# Patient Record
Sex: Male | Born: 1990 | Race: Black or African American | Hispanic: No | Marital: Single | Smoking: Never smoker
Health system: Southern US, Community
[De-identification: ages and names within clinical notes are randomized; demographics above are authoritative.]

## PROBLEM LIST (undated history)

## (undated) HISTORY — PX: ROTATOR CUFF REPAIR: SHX139

---
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
## 2012-06-02 ENCOUNTER — Encounter (HOSPITAL_COMMUNITY): Payer: Self-pay | Admitting: *Deleted

## 2012-06-02 ENCOUNTER — Emergency Department (HOSPITAL_COMMUNITY)
Admission: EM | Admit: 2012-06-02 | Discharge: 2012-06-03 | Disposition: A | Discharge status: Home / Self Care | Payer: Self-pay | Attending: Emergency Medicine | Admitting: Emergency Medicine

## 2012-06-02 DIAGNOSIS — R300 Dysuria: Secondary | ICD-10-CM | POA: Insufficient documentation

## 2012-06-02 DIAGNOSIS — R369 Urethral discharge, unspecified: Secondary | ICD-10-CM | POA: Insufficient documentation

## 2012-06-02 LAB — URINALYSIS, ROUTINE W REFLEX MICROSCOPIC
Ketones, ur: NEGATIVE mg/dL
Leukocytes, UA: NEGATIVE
Nitrite: NEGATIVE
Protein, ur: NEGATIVE mg/dL
Urobilinogen, UA: 1 mg/dL (ref 0.0–1.0)
pH: 6 (ref 5.0–8.0)

## 2012-06-02 NOTE — ED Notes (Signed)
Pt c/o pain with urination x 1 wk; denies penile discharge

## 2012-06-03 MED ORDER — CEFTRIAXONE SODIUM 250 MG IJ SOLR
250.0000 mg | Freq: Once | INTRAMUSCULAR | Status: AC
  Administered 2012-06-03: 250 mg via INTRAMUSCULAR
  Filled 2012-06-03: qty 250

## 2012-06-03 MED ORDER — AZITHROMYCIN 250 MG PO TABS
1000.0000 mg | ORAL_TABLET | Freq: Once | ORAL | Status: AC
  Administered 2012-06-03: 1000 mg via ORAL
  Filled 2012-06-03: qty 4

## 2012-06-03 NOTE — ED Provider Notes (Signed)
History     CSN: [id number]  Arrival date & time 06/02/12  2131   First MD Initiated Contact with Patient 06/02/12 2331      Chief Complaint  Patient presents with  . urinary symptoms     (Consider location/radiation/quality/duration/timing/severity/associated sxs/prior treatment) Patient is a 21 y.o. male presenting with dysuria. The history is provided by the patient.  Dysuria  This is a new problem. The current episode started more than 2 days ago. The problem occurs every urination. The problem has not changed since onset.There has been no fever. Pertinent negatives include no nausea and no flank pain. Associated symptoms comments: He complains of burning with urination for one week without fever, abdominal pain, testicular or scrotal pain or swelling. He has had an inconsistent penile discharge.Marland Kitchen    History reviewed. No pertinent past medical history.  History reviewed. No pertinent past surgical history.  No family history on file.  History  Substance Use Topics  . Smoking status: Never Smoker   . Smokeless tobacco: Not on file  . Alcohol Use: No      Review of Systems  Constitutional: Negative for fever.  Gastrointestinal: Negative for nausea and abdominal pain.  Genitourinary: Positive for dysuria and discharge. Negative for flank pain.  Musculoskeletal: Negative for myalgias.    Allergies  Review of patient's allergies indicates no known allergies.  Home Medications  No current outpatient prescriptions on file.  BP 135/62  Pulse 58  Temp 97.7 F (36.5 C)  Resp 20  SpO2 99%  Physical Exam  Constitutional: He is oriented to person, place, and time. He appears well-developed and well-nourished.  Neck: Normal range of motion.  Pulmonary/Chest: Effort normal.  Abdominal: Soft. There is no tenderness. There is no guarding.  Genitourinary:       No penile discharge on exam. No testicular pain or scrotal swelling. No palpable inguinal lymphadenopathy.   Musculoskeletal: Normal range of motion.  Neurological: He is alert and oriented to person, place, and time.  Skin: Skin is warm and dry.  Psychiatric: He has a normal mood and affect.    ED Course  Procedures (including critical care time)  Labs Reviewed  URINALYSIS, ROUTINE W REFLEX MICROSCOPIC - Abnormal; Notable for the following:    APPearance CLOUDY (*)     Specific Gravity, Urine 1.037 (*)     Bilirubin Urine SMALL (*)     All other components within normal limits  GC/CHLAMYDIA PROBE AMP   No results found.   No diagnosis found.  1. Dysuria.   MDM  Dysuria and c/o penile discharge c/w likely STD. Abx given. Referred to [facility].        Rodena Medin, PA-C 06/03/12 0101

## 2012-06-03 NOTE — ED Provider Notes (Signed)
Medical screening examination/treatment/procedure(s) were performed by non-physician practitioner and as supervising physician I was immediately available for consultation/collaboration.   Sian Joles M Khristian Phillippi, MD 06/03/12 0819 

## 2012-08-15 ENCOUNTER — Encounter (HOSPITAL_COMMUNITY): Payer: Self-pay | Admitting: *Deleted

## 2012-08-15 ENCOUNTER — Emergency Department (HOSPITAL_COMMUNITY)
Admission: EM | Admit: 2012-08-15 | Discharge: 2012-08-15 | Disposition: A | Discharge status: Home / Self Care | Payer: Self-pay | Attending: Emergency Medicine | Admitting: Emergency Medicine

## 2012-08-15 ENCOUNTER — Emergency Department (HOSPITAL_COMMUNITY): Payer: Self-pay

## 2012-08-15 DIAGNOSIS — R109 Unspecified abdominal pain: Secondary | ICD-10-CM | POA: Insufficient documentation

## 2012-08-15 DIAGNOSIS — R110 Nausea: Secondary | ICD-10-CM | POA: Insufficient documentation

## 2012-08-15 LAB — COMPREHENSIVE METABOLIC PANEL
AST: 26 U/L (ref 0–37)
CO2: 28 mEq/L (ref 19–32)
Calcium: 9.5 mg/dL (ref 8.4–10.5)
Creatinine, Ser: 1.16 mg/dL (ref 0.50–1.35)
GFR calc Af Amer: >90 mL/min (ref >90)
GFR calc non Af Amer: 90 mL/min — ABNORMAL LOW (ref >90)
Sodium: 138 mEq/L (ref 135–145)
Total Protein: 7.6 g/dL (ref 6.0–8.3)

## 2012-08-15 LAB — CBC WITH DIFFERENTIAL/PLATELET
Basophils Absolute: 0 10*3/uL (ref 0.0–0.1)
Eosinophils Absolute: 0.1 10*3/uL (ref 0.0–0.7)
Eosinophils Relative: 2 % (ref 0–5)
HCT: 46.6 % (ref 39.0–52.0)
Lymphocytes Relative: 30 % (ref 12–46)
MCH: 30.4 pg (ref 26.0–34.0)
MCHC: 34.5 g/dL (ref 30.0–36.0)
MCV: 87.9 fL (ref 78.0–100.0)
Monocytes Absolute: 0.7 10*3/uL (ref 0.1–1.0)
Platelets: 204 10*3/uL (ref 150–400)
RDW: 12.8 % (ref 11.5–15.5)
WBC: 5.1 10*3/uL (ref 4.0–10.5)

## 2012-08-15 LAB — URINALYSIS, MICROSCOPIC ONLY
Glucose, UA: NEGATIVE mg/dL
Leukocytes, UA: NEGATIVE
Protein, ur: NEGATIVE mg/dL
Specific Gravity, Urine: 1.026 (ref 1.005–1.030)
Urobilinogen, UA: 1 mg/dL (ref 0.0–1.0)

## 2012-08-15 MED ORDER — IOHEXOL 300 MG/ML  SOLN
100.0000 mL | Freq: Once | INTRAMUSCULAR | Status: AC | PRN
  Administered 2012-08-15: 100 mL via INTRAVENOUS

## 2012-08-15 MED ORDER — DICYCLOMINE HCL 20 MG PO TABS: 20.0000 mg | ORAL_TABLET | Freq: Four times a day (QID) | ORAL | Status: DC | PRN

## 2012-08-15 MED ORDER — IOHEXOL 300 MG/ML  SOLN: 50.0000 mL | Freq: Once | INTRAMUSCULAR | Status: DC | PRN

## 2012-08-15 MED ORDER — KETOROLAC TROMETHAMINE 15 MG/ML IJ SOLN
15.0000 mg | Freq: Once | INTRAMUSCULAR | Status: AC
  Administered 2012-08-15: 15 mg via INTRAVENOUS
  Filled 2012-08-15: qty 1

## 2012-08-15 NOTE — ED Provider Notes (Signed)
History    21yM with abdominal pain. Intermittent for over a month but worst in past week. Onset shortly after eating or drinking anything. Sharp and brief duration. Lower abdomen, typically worse on L side. No n/v. No urinary complaints. No testicular pain or swelling. No hxof abdominal surgery. Otherwise healthy.  CSN: [id number]  Arrival date & time 08/15/12  4098   First MD Initiated Contact with Patient 08/15/12 [phone]      Chief Complaint  Patient presents with  . Abdominal Pain    (Consider location/radiation/quality/duration/timing/severity/associated sxs/prior treatment) HPI  History reviewed. No pertinent past medical history.  History reviewed. No pertinent past surgical history.  History reviewed. No pertinent family history.  History  Substance Use Topics  . Smoking status: Never Smoker   . Smokeless tobacco: Not on file  . Alcohol Use: No      Review of Systems  All systems reviewed and negative, other than as noted in HPI.   Allergies  Review of patient's allergies indicates no known allergies.  Home Medications   Current Outpatient Rx  Name  Route  Sig  Dispense  Refill  . ibuprofen (ADVIL,MOTRIN) 800 MG tablet   Oral   Take 800 mg by mouth every 8 (eight) hours as needed for pain.           BP 129/74  Pulse 64  Temp(Src) 98.8 F (37.1 C) (Oral)  Resp 16  SpO2 99%  Physical Exam  Nursing note and vitals reviewed. Constitutional: He appears well-developed and well-nourished. No distress.  HENT:  Head: Normocephalic and atraumatic.  Eyes: Conjunctivae are normal. Right eye exhibits no discharge. Left eye exhibits no discharge.  Neck: Neck supple.  Cardiovascular: Normal rate, regular rhythm and normal heart sounds.  Exam reveals no gallop and no friction rub.   No murmur heard. Pulmonary/Chest: Effort normal and breath sounds normal. No respiratory distress.  Abdominal: Soft. He exhibits no distension. There is no tenderness.  Mild  tenderness in right lower and left lower quadrants without guarding or rebound tenderness. No distention. No mass palpated.  Musculoskeletal: He exhibits no edema and no tenderness.  Neurological: He is alert.  Skin: Skin is warm and dry.  Psychiatric: He has a normal mood and affect. His behavior is normal. Thought content normal.    ED Course  Procedures (including critical care time)  Labs Reviewed  CBC WITH DIFFERENTIAL - Abnormal; Notable for the following:    Monocytes Relative 14 (*)    All other components within normal limits  COMPREHENSIVE METABOLIC PANEL - Abnormal; Notable for the following:    GFR calc non Af Amer 90 (*)    All other components within normal limits  URINALYSIS, MICROSCOPIC ONLY - Abnormal; Notable for the following:    Hgb urine dipstick MODERATE (*)    All other components within normal limits  LIPASE, BLOOD   No results found.  Ct Abdomen Pelvis W Contrast  08/15/2012  *RADIOLOGY REPORT*  Clinical Data: Abdominal pain.  Left lower quadrant pain.  Nausea.  CT ABDOMEN AND PELVIS WITH CONTRAST  Technique:  Multidetector CT imaging of the abdomen and pelvis was performed following the standard protocol during bolus administration of intravenous contrast.  Contrast: OMNIPAQUE IOHEXOL 300 MG/ML  SOLN  Comparison: None.  Findings: Lung bases clear.  Liver and spleen appear normal. Gallbladder and common bile duct normal.  Pancreas normal.  Kidneys and adrenal glands are normal.  There is normal renal enhancement. The stomach is normal.  Small bowel also appears normal.  There is prominent stool burden.  Normal appendix is identified in the right lower quadrant.  No free fluid in the abdomen.  The urinary bladder appears normal.  There are no colonic inflammatory changes.  There is a large stool burden.  Mesenteric fat appears within normal limits.  There are no aggressive osseous lesions.  No pars defects. Sacralization of the left L5 transverse process is  noted with pseudoarthrosis.  IMPRESSION: No acute abnormality.   Original Report Authenticated By: Andreas Newport, M.D.     1. Abdominal pain       MDM  21yM with abdominal pain. Mild tenderness on exam and reassuring w/u. Low suspicion for emergent etiology. Emergent return precautions discussed. Outpt FU otherwise.        Raeford Razor, MD 08/18/12 [phone]

## 2012-08-15 NOTE — ED Notes (Signed)
Pt reports left lower quadrant abdominal pain 9/10 every time he eats/ drinks x7 days. "hurts when he breathes sometimes". Last bowel movement 2 days ago. Reports nausea, denies v/d.

## 2012-08-15 NOTE — ED Notes (Signed)
Patient transported to CT 

## 2012-11-28 ENCOUNTER — Emergency Department (HOSPITAL_COMMUNITY)
Admission: EM | Admit: 2012-11-28 | Discharge: 2012-11-29 | Disposition: A | Discharge status: Home / Self Care | Payer: Self-pay | Attending: Emergency Medicine | Admitting: Emergency Medicine

## 2012-11-28 ENCOUNTER — Encounter (HOSPITAL_COMMUNITY): Payer: Self-pay

## 2012-11-28 DIAGNOSIS — R109 Unspecified abdominal pain: Secondary | ICD-10-CM

## 2012-11-28 DIAGNOSIS — R1031 Right lower quadrant pain: Secondary | ICD-10-CM | POA: Insufficient documentation

## 2012-11-28 LAB — CBC WITH DIFFERENTIAL/PLATELET
Basophils Relative: 0 % (ref 0–1)
Eosinophils Absolute: 0.2 10*3/uL (ref 0.0–0.7)
Eosinophils Relative: 2 % (ref 0–5)
HCT: 43.9 % (ref 39.0–52.0)
Hemoglobin: 14.8 g/dL (ref 13.0–17.0)
MCH: 29.3 pg (ref 26.0–34.0)
MCHC: 33.7 g/dL (ref 30.0–36.0)
MCV: 86.9 fL (ref 78.0–100.0)
Monocytes Absolute: 0.7 10*3/uL (ref 0.1–1.0)
Monocytes Relative: 10 % (ref 3–12)
RDW: 12.9 % (ref 11.5–15.5)

## 2012-11-28 LAB — URINALYSIS, ROUTINE W REFLEX MICROSCOPIC
Bilirubin Urine: NEGATIVE
Hgb urine dipstick: NEGATIVE
Ketones, ur: NEGATIVE mg/dL
Specific Gravity, Urine: 1.037 — ABNORMAL HIGH (ref 1.005–1.030)
Urobilinogen, UA: 1 mg/dL (ref 0.0–1.0)

## 2012-11-28 LAB — BASIC METABOLIC PANEL
BUN: 16 mg/dL (ref 6–23)
Calcium: 9.9 mg/dL (ref 8.4–10.5)
Chloride: 101 mEq/L (ref 96–112)
Creatinine, Ser: 1.03 mg/dL (ref 0.50–1.35)
GFR calc Af Amer: >90 mL/min (ref >90)
GFR calc non Af Amer: >90 mL/min (ref >90)

## 2012-11-28 NOTE — ED Provider Notes (Signed)
History     CSN: [id number]  Arrival date & time 11/28/12  2116   First MD Initiated Contact with Patient 11/28/12 2259      Chief Complaint  Patient presents with  . Abdominal Pain    HPI  History provided by the patient. Patient is a 22 year old male with no significant PMH who presents with complaints of continued and persistent right lower abdominal pain and discomfort. Symptoms began 3 days ago with a squeezing pain to the right lower cautery. Pain has been persistent without any significant change over the 3 days. He does report episodes of cramps and sharp pains lasting 2 minutes at most. These have not increased in frequency and have continued to happen at anytime during the day. He does not recognize any pattern or aggravating factors to cause worsening cramp pains. He has slight decrease in appetite but has continued to eat without change in pain. Denies any associated nausea or vomiting. Denies any fever, chills or sweats. No diarrhea or constipation. Pain is not changed with urination or defecation. Denies any dysuria, hematuria or flank pain. He has not used any treatments or medications for symptoms. No other aggravating or alleviating factors. No other associated symptoms.    History reviewed. No pertinent past medical history.  Past Surgical History  Procedure Laterality Date  . Rotator cuff repair      No family history on file.  History  Substance Use Topics  . Smoking status: Never Smoker   . Smokeless tobacco: Not on file  . Alcohol Use: No      Review of Systems  Constitutional: Positive for appetite change. Negative for fever, chills and diaphoresis.  Gastrointestinal: Positive for abdominal pain. Negative for nausea, vomiting, diarrhea, constipation and blood in stool.  Genitourinary: Negative for dysuria, frequency, hematuria, flank pain, discharge, scrotal swelling and testicular pain.  All other systems reviewed and are negative.    Allergies   Review of patient's allergies indicates no known allergies.  Home Medications  No current outpatient prescriptions on file.  BP 123/68  Pulse 63  Temp(Src) 99 F (37.2 C) (Oral)  Resp 18  Ht 5\' 3"  (1.6 m)  Wt 150 lb (68.04 kg)  BMI 26.58 kg/m2  SpO2 99%  Physical Exam  Nursing note and vitals reviewed. Constitutional: He is oriented to person, place, and time. He appears well-developed and well-nourished. No distress.  HENT:  Head: Normocephalic.  Cardiovascular: Normal rate and regular rhythm.   Pulmonary/Chest: Effort normal and breath sounds normal. No respiratory distress. He has no wheezes. He has no rales.  Abdominal: Soft. There is no hepatosplenomegaly. There is tenderness in the right lower quadrant. There is no rigidity, no rebound, no guarding, no CVA tenderness and negative Murphy's sign.  Musculoskeletal: Normal range of motion.  Neurological: He is alert and oriented to person, place, and time.  Skin: Skin is warm.  Psychiatric: He has a normal mood and affect. His behavior is normal.    ED Course  Procedures   Results for orders placed during the hospital encounter of 11/28/12  CBC WITH DIFFERENTIAL      Result Value Range   WBC 7.5  4.0 - 10.5 K/uL   RBC 5.05  4.22 - 5.81 MIL/uL   Hemoglobin 14.8  13.0 - 17.0 g/dL   HCT 13.2  44.0 - 10.2 %   MCV 86.9  78.0 - 100.0 fL   MCH 29.3  26.0 - 34.0 pg   MCHC 33.7  30.0 -  36.0 g/dL   RDW 16.1  09.6 - 04.5 %   Platelets 232  150 - 400 K/uL   Neutrophils Relative % 40 (*) 43 - 77 %   Neutro Abs 3.0  1.7 - 7.7 K/uL   Lymphocytes Relative 49 (*) 12 - 46 %   Lymphs Abs 3.6  0.7 - 4.0 K/uL   Monocytes Relative 10  3 - 12 %   Monocytes Absolute 0.7  0.1 - 1.0 K/uL   Eosinophils Relative 2  0 - 5 %   Eosinophils Absolute 0.2  0.0 - 0.7 K/uL   Basophils Relative 0  0 - 1 %   Basophils Absolute 0.0  0.0 - 0.1 K/uL  BASIC METABOLIC PANEL      Result Value Range   Sodium 137  135 - 145 mEq/L   Potassium 3.8  3.5  - 5.1 mEq/L   Chloride 101  96 - 112 mEq/L   CO2 26  19 - 32 mEq/L   Glucose, Bld 101 (*) 70 - 99 mg/dL   BUN 16  6 - 23 mg/dL   Creatinine, Ser 4.09  0.50 - 1.35 mg/dL   Calcium 9.9  8.4 - 81.1 mg/dL   GFR calc non Af Amer >90  >90 mL/min   GFR calc Af Amer >90  >90 mL/min  LIPASE, BLOOD      Result Value Range   Lipase 22  11 - 59 U/L  URINALYSIS, ROUTINE W REFLEX MICROSCOPIC      Result Value Range   Color, Urine YELLOW  YELLOW   APPearance CLEAR  CLEAR   Specific Gravity, Urine 1.037 (*) 1.005 - 1.030   pH 6.0  5.0 - 8.0   Glucose, UA NEGATIVE  NEGATIVE mg/dL   Hgb urine dipstick NEGATIVE  NEGATIVE   Bilirubin Urine NEGATIVE  NEGATIVE   Ketones, ur NEGATIVE  NEGATIVE mg/dL   Protein, ur NEGATIVE  NEGATIVE mg/dL   Urobilinogen, UA 1.0  0.0 - 1.0 mg/dL   Nitrite NEGATIVE  NEGATIVE   Leukocytes, UA NEGATIVE  NEGATIVE        1. Abdominal pain       MDM  11:00PM patient seen and evaluated. The patient appears comfortable and sleep. He wakes easily denies significant discomfort or pains at this time. Patient does have primary discomforts in the right lower quadrant on abdominal exam. No peritoneal signs. Patient afebrile. Normal WBC.   I discussed options with patient have a CAT scan performed for further evaluation of the appendix. I discussed the benefits, risks and alternatives and at this time he does not wish to have a CAT scan performed. He was given strict return precautions and advised to have a recheck of symptoms the next 24 hours.     Angus Seller, PA-C 11/29/12 0000

## 2012-11-28 NOTE — ED Notes (Signed)
Pt presents with abdominal pain that started three days ago. Pt has had no vomiting or diarrhea. Pt says pain is in the lower right quadrant. Pt still has his appendix.

## 2012-11-28 NOTE — ED Notes (Signed)
Pt states pain (tightening) comes and goes in right lower quadrant of abdomen for the past 3 days.

## 2012-11-29 NOTE — ED Provider Notes (Signed)
Medical screening examination/treatment/procedure(s) were performed by non-physician practitioner and as supervising physician I was immediately available for consultation/collaboration.  Jacayla Nordell M Farrell Broerman, MD 11/29/12 0609 

## 2012-12-02 ENCOUNTER — Emergency Department (HOSPITAL_COMMUNITY): Payer: Self-pay

## 2012-12-02 ENCOUNTER — Emergency Department (HOSPITAL_COMMUNITY)
Admission: EM | Admit: 2012-12-02 | Discharge: 2012-12-03 | Disposition: A | Discharge status: Home / Self Care | Payer: Self-pay | Attending: Emergency Medicine | Admitting: Emergency Medicine

## 2012-12-02 DIAGNOSIS — R112 Nausea with vomiting, unspecified: Secondary | ICD-10-CM | POA: Insufficient documentation

## 2012-12-02 DIAGNOSIS — R195 Other fecal abnormalities: Secondary | ICD-10-CM | POA: Insufficient documentation

## 2012-12-02 DIAGNOSIS — R109 Unspecified abdominal pain: Secondary | ICD-10-CM

## 2012-12-02 DIAGNOSIS — R1031 Right lower quadrant pain: Secondary | ICD-10-CM | POA: Insufficient documentation

## 2012-12-02 LAB — CBC WITH DIFFERENTIAL/PLATELET
Eosinophils Absolute: 0.1 10*3/uL (ref 0.0–0.7)
Eosinophils Relative: 1 % (ref 0–5)
Lymphs Abs: 2.5 10*3/uL (ref 0.7–4.0)
MCH: 29.9 pg (ref 26.0–34.0)
MCV: 86.8 fL (ref 78.0–100.0)
Monocytes Relative: 12 % (ref 3–12)
Platelets: 257 10*3/uL (ref 150–400)
RBC: 5.15 MIL/uL (ref 4.22–5.81)

## 2012-12-02 LAB — COMPREHENSIVE METABOLIC PANEL
BUN: 15 mg/dL (ref 6–23)
Calcium: 9.8 mg/dL (ref 8.4–10.5)
GFR calc Af Amer: >90 mL/min (ref >90)
Glucose, Bld: 100 mg/dL — ABNORMAL HIGH (ref 70–99)
Sodium: 139 mEq/L (ref 135–145)
Total Protein: 7.7 g/dL (ref 6.0–8.3)

## 2012-12-02 LAB — URINALYSIS, ROUTINE W REFLEX MICROSCOPIC
Nitrite: NEGATIVE
Specific Gravity, Urine: 1.041 — ABNORMAL HIGH (ref 1.005–1.030)
Urobilinogen, UA: 1 mg/dL (ref 0.0–1.0)

## 2012-12-02 LAB — LIPASE, BLOOD: Lipase: 20 U/L (ref 11–59)

## 2012-12-02 MED ORDER — IOHEXOL 300 MG/ML  SOLN
50.0000 mL | Freq: Once | INTRAMUSCULAR | Status: AC | PRN
  Administered 2012-12-02: 50 mL via ORAL

## 2012-12-02 MED ORDER — SODIUM CHLORIDE 0.9 % IV BOLUS (SEPSIS)
1000.0000 mL | Freq: Once | INTRAVENOUS | Status: AC
  Administered 2012-12-02: 1000 mL via INTRAVENOUS

## 2012-12-02 MED ORDER — IOHEXOL 300 MG/ML  SOLN
100.0000 mL | Freq: Once | INTRAMUSCULAR | Status: AC | PRN
  Administered 2012-12-02: 100 mL via INTRAVENOUS

## 2012-12-02 NOTE — ED Notes (Signed)
Pt states that he was here last week for RLQ pain but refused CT. Pt states now that he has no appetite, continuing pain and n/v/d.

## 2012-12-02 NOTE — ED Provider Notes (Signed)
History     CSN: [id number]  Arrival date & time 12/02/12  1843   First MD Initiated Contact with Patient 12/02/12 1911      Chief Complaint  Patient presents with  . RLQ pain     (Consider location/radiation/quality/duration/timing/severity/associated sxs/prior treatment) HPI This 22 year old healthy male has several days of gradual onset right lower quadrant abdominal pain and now has a couple of days of nausea with a small amount of nonbloody vomiting today when he tried to eat as well as small amount couple loose stools today which is not large or bloody, his pain started out mildly and is now moderately severe worse with palpation and position changes without radiation or associated symptoms other than he mild nausea and small amounts of vomiting and loose stools today he is no testicular pain no dysuria no radiation no chest pain no shortness of breath no cough no fever no rash no trauma no colicky pain no flank pain no back pain and no treatment prior to arrival.  No past medical history on file.  Past Surgical History  Procedure Laterality Date  . Rotator cuff repair      No family history on file.  History  Substance Use Topics  . Smoking status: Never Smoker   . Smokeless tobacco: Not on file  . Alcohol Use: No      Review of Systems 10 Systems reviewed and are negative for acute change except as noted in the HPI. Allergies  Review of patient's allergies indicates no known allergies.  Home Medications   Current Outpatient Rx  Name  Route  Sig  Dispense  Refill  . acetaminophen (TYLENOL) 500 MG tablet   Oral   Take 500 mg by mouth every 6 (six) hours as needed for pain.         . promethazine (PHENERGAN) 25 MG tablet   Oral   Take 1 tablet (25 mg total) by mouth every 6 (six) hours as needed for nausea.   12 tablet   0     BP 137/76  Pulse 77  Temp(Src) 98.8 F (37.1 C) (Oral)  SpO2 97%  Physical Exam  Nursing note and vitals  reviewed. Constitutional:  Awake, alert, nontoxic appearance.  HENT:  Head: Atraumatic.  Eyes: Right eye exhibits no discharge. Left eye exhibits no discharge.  Neck: Neck supple.  Cardiovascular: Normal rate and regular rhythm.   No murmur heard. Pulmonary/Chest: Effort normal and breath sounds normal. No respiratory distress. He has no wheezes. He has no rales. He exhibits no tenderness.  Abdominal: Soft. Bowel sounds are normal. He exhibits no distension and no mass. There is tenderness. There is rebound and guarding.  Mild guarding moderate right lower quadrant tenderness and minimal right lower quadrant question slight localized rebound without generalized peritonitis in the rest of the abdomen is nontender he has no palpable hernias and testicles are nontender  Musculoskeletal: He exhibits no tenderness.  Baseline ROM, no obvious new focal weakness.  Neurological:  Mental status and motor strength appears baseline for patient and situation.  Skin: No rash noted.  Psychiatric: He has a normal mood and affect.    ED Course  Procedures (including critical care time) Patient / Family / Caregiver understand and agree with initial ED impression and plan with expectations set for ED visit.  2300: CT no inflammatory changes but appendix not well visualized; no peritonitis on exam but d/w Surg who will see Pt in ED after finish OR case, Pt aware.  Pt seen by [facility] in ED who recs discharge.   Labs Reviewed  CBC WITH DIFFERENTIAL - Abnormal; Notable for the following:    Monocytes Absolute 1.1 (*)    All other components within normal limits  COMPREHENSIVE METABOLIC PANEL - Abnormal; Notable for the following:    Glucose, Bld 100 (*)    All other components within normal limits  URINALYSIS, ROUTINE W REFLEX MICROSCOPIC - Abnormal; Notable for the following:    Specific Gravity, Urine 1.041 (*)    All other components within normal limits  LIPASE, BLOOD   Dg Chest 2 View  12/03/2012    *RADIOLOGY REPORT*  Clinical Data:  Chest pain and shortness of breath.  CHEST - 2 VIEW  Comparison: None  Findings: The heart size and mediastinal contours are within normal limits.  Both lungs are clear.  The visualized skeletal structures are unremarkable.  IMPRESSION: No active disease.   Original Report Authenticated By: Irish Lack, M.D.   Ct Abdomen Pelvis W Contrast  12/02/2012   *RADIOLOGY REPORT*  Clinical Data: Right lower quadrants abdominal pain.  CT ABDOMEN AND PELVIS WITH CONTRAST  Technique:  Multidetector CT imaging of the abdomen and pelvis was performed following the standard protocol during bolus administration of intravenous contrast.  Contrast: OMNIPAQUE IOHEXOL 300 MG/ML  SOLN, 50mL OMNIPAQUE IOHEXOL 300 MG/ML  SOLN  Comparison: 08/15/2012  Findings: The appendix is difficult to visualize by CT due to crowding of bowel loops secondary to paucity of mesenteric fat. There clearly is no evidence of an inflammatory process in the right lower quadrant to suggest acute appendicitis by CT.  There is no evidence of bowel obstruction or free air.  The liver, gallbladder, pancreas, spleen, adrenal glands and kidneys are within normal limits.  No hernias, masses or enlarged lymph nodes are seen.  The bony structures are unremarkable.  IMPRESSION: No acute findings.  No evidence by CT of acute appendicitis.   Original Report Authenticated By: Irish Lack, M.D.     1. Abdominal pain       MDM  I doubt any other [facility] precluding discharge at this time including, but not necessarily limited to the following:generalized peritonitis.        Hurman Horn, MD 12/03/12 2135

## 2012-12-03 ENCOUNTER — Encounter (HOSPITAL_COMMUNITY): Payer: Self-pay | Admitting: Emergency Medicine

## 2012-12-03 ENCOUNTER — Emergency Department (HOSPITAL_COMMUNITY): Payer: Self-pay

## 2012-12-03 ENCOUNTER — Emergency Department (HOSPITAL_COMMUNITY)
Admission: EM | Admit: 2012-12-03 | Discharge: 2012-12-03 | Disposition: A | Discharge status: Home / Self Care | Payer: Self-pay | Attending: Emergency Medicine | Admitting: Emergency Medicine

## 2012-12-03 DIAGNOSIS — R509 Fever, unspecified: Secondary | ICD-10-CM | POA: Insufficient documentation

## 2012-12-03 DIAGNOSIS — R209 Unspecified disturbances of skin sensation: Secondary | ICD-10-CM | POA: Insufficient documentation

## 2012-12-03 DIAGNOSIS — R0789 Other chest pain: Secondary | ICD-10-CM | POA: Insufficient documentation

## 2012-12-03 DIAGNOSIS — R109 Unspecified abdominal pain: Secondary | ICD-10-CM

## 2012-12-03 DIAGNOSIS — R1031 Right lower quadrant pain: Secondary | ICD-10-CM | POA: Insufficient documentation

## 2012-12-03 DIAGNOSIS — R112 Nausea with vomiting, unspecified: Secondary | ICD-10-CM | POA: Insufficient documentation

## 2012-12-03 DIAGNOSIS — R079 Chest pain, unspecified: Secondary | ICD-10-CM

## 2012-12-03 DIAGNOSIS — R519 Headache, unspecified: Secondary | ICD-10-CM

## 2012-12-03 MED ORDER — PROMETHAZINE HCL 25 MG PO TABS: 25.0000 mg | ORAL_TABLET | Freq: Four times a day (QID) | ORAL | Status: DC | PRN

## 2012-12-03 MED ORDER — IBUPROFEN 800 MG PO TABS
800.0000 mg | ORAL_TABLET | Freq: Once | ORAL | Status: AC
  Administered 2012-12-03: 800 mg via ORAL
  Filled 2012-12-03: qty 1

## 2012-12-03 NOTE — ED Notes (Signed)
Per pt, RLQ pain, was here last night with same symptoms-had CT done-discussed removal of appendix

## 2012-12-03 NOTE — Consult Note (Signed)
Keith Poole, MCHAN               ACCOUNT NO.:  [id number]  MEDICAL RECORD NO.:  [id number]  LOCATION:  WA14                         FACILITY:  [facility]  PHYSICIAN:  Lodema Pilot, MD       DATE OF BIRTH:  22-Jul-1990  DATE OF CONSULTATION:  12/03/2012 DATE OF DISCHARGE:                                CONSULTATION   CONSULTING PROVIDER:  Wayland Salinas, MD  REASON FOR CONSULTATION:  Abdominal pain.  HISTORY OF PRESENT ILLNESS:  Mr. Goeden is a 22 year old male who has a prior history of similar symptoms several months ago, but up until a week ago, was back to feeling normal in his usual state of health, which is normally pretty healthy.  Four days ago, he was in the emergency room for similar symptoms of right lower quadrant abdominal pain, which began 3 days before that visit and 7 days before today's presentation.  He says that it feels as though someone is poking a screwdriver into him and turning.  He says that the pain waxes and wanes.  He has also had some nausea and vomiting, and says that he has had a hard time keeping even liquids down, but he can keep water down.  He did eat some [employer] today and he threw it right back up.  He has not had any fevers or chills.  He says that his bowels are functioning normally without any blood in the stools or melena.  He denies any blood in his urine.  He says that his pain is about 3/10 currently.  PAST MEDICAL HISTORY:  None.  PAST SURGICAL HISTORY:  Rotator cuff surgery.  FAMILY HISTORY:  He denies any family history of abdominal problems such as inflammatory bowel or malignancies.  SOCIAL HISTORY:  He denies smoking, alcohol or illegal drug use.  ALLERGIES:  None.  MEDICATIONS:  None.  REVIEW OF SYSTEMS:  Otherwise negative except as listed in the history of present illness.  PHYSICAL EXAMINATION:  GENERAL:  He is in no acute distress, resting comfortably in the bed. VITAL SIGNS:  His temperature is 98.8, heart rate is 77,  blood pressure is 137/76, O2 sats 97% on room air. HEENT:  His head is normocephalic, atraumatic.  His eyes are normal without any scleral icterus.  Extraocular muscles intact. NECK:  Soft, trachea is midline.  No JVD. LUNGS:  Respirations are nonlabored without any wheezes. CARDIOVASCULAR:  His heart rate is normal with regular rhythm. ABDOMEN:  Soft.  He has mild tenderness in the right lower quadrant, but there was no other significant tenderness.  His abdomen is nondistended. He did not have any obvious hernias and he does not have any peritoneal signs and no guarding in the right lower quadrant. EXTREMITIES:  Normal range of motion and pulses are palpable and symmetric bilaterally.  LABORATORY STUDIES:  Demonstrate a white blood cell count of 8.7, hemoglobin 15.4, hematocrit of 44.7, platelets 257.  58% neutrophils. Sodium is 139, potassium 3.6, chloride 103, CO2 27.  BUN is 15, creatinine is 1.13, glucose is 100, alk phos 62, albumin 4.4, lipase 20. AST 21, ALT 14, bilirubin is 0.4.  RADIOGRAPHIC STUDIES:  He had a CT scan  of the abdomen, which did not demonstrate any inflammatory changes in the right lower quadrant and there was no evidence of acute appendicitis or other obvious cause of his symptoms.  ASSESSMENT:  Abdominal pain.  I doubt that this is acute appendicitis. I did given the fact that he has had similar episodes in the last few months and that this has been going on for 7 days with waxing and waning severity and he has normal laboratory studies without any left shift and CT scan without any inflammatory changes or any concerns for appendicitis, and his physical exam is not very concerning for appendicitis.  I have a low suspicion that this is truly acute appendicitis.  I explained to the patient and his girlfriend that we cannot completely rule this out without surgery to remove the appendix, and he is not interested in any surgery currently.  I think that  given the evidence that we have, I think it is pretty low likelihood that this is acute appendicitis, although this certainly could develop.  I discuss and offer diagnostic laparoscopy and surgery to him, which again he is not interested in, and I would not recommend at this time.  I also discussed with him the option for overnight observation to see if his symptoms worsen overnight and he is not interested in this as well. Again, I think that this is probably pretty low risk for acute appendicitis given the evidence that we have and I think that observation would be appropriate for this patient.  If he could not truly keep fluid or water down, then he may need admission for hydration and abdominal exams.  I have discussed this with the emergency room doctor, Dr. Fonnie Jarvis.  Plan is disposition per the emergency room and follow up with his primary care physician.          ______________________________ Lodema Pilot, MD     BL/MEDQ  D:  12/03/2012  T:  12/03/2012  Job:  [id number]

## 2012-12-03 NOTE — ED Provider Notes (Signed)
History     CSN: [id number]  Arrival date & time 12/03/12  1708   First MD Initiated Contact with Patient 12/03/12 1736      Chief Complaint  Patient presents with  . RLQ pain     (Consider location/radiation/quality/duration/timing/severity/associated sxs/prior treatment) HPI Pt is a 22yo male presenting for fourth time since Feb for abdominal pain in RLQ. Pt is in RLQ constant waxing and waning 7/10 described as sharp twisting pain.  Nothing seems to make pain better or worse.  Was seen last night for same, had relatively negative workup for appendicitis.  CBC, BMP, Lipase, UA: unremarkable.  CT was performed showing no edivdence of acute appendicitis, however appendix was difficult to visualize.  Pt spoke with surgeon who discussed appendectomy, pt initially refused due to fear of procedure, however returned today due to new symptoms of elevated temp of 102, mild headahce, chest pain and numbness in hands and feet.  Pt states he did take tylenol which reduced fever and eased HA.  CP and numbness have resolved. Pt reports 1 episode of NBNB emesis earlier this morning.  Denies SOB, diarrhea, or dysuria.    History reviewed. No pertinent past medical history.  Past Surgical History  Procedure Laterality Date  . Rotator cuff repair      No family history on file.  History  Substance Use Topics  . Smoking status: Never Smoker   . Smokeless tobacco: Not on file  . Alcohol Use: No      Review of Systems  Constitutional: Positive for fever. Negative for chills.  Respiratory: Negative for cough and shortness of breath.   Cardiovascular: Positive for chest pain.  Gastrointestinal: Positive for nausea, vomiting and abdominal pain ( RLQ). Negative for diarrhea and abdominal distention.  All other systems reviewed and are negative.    Allergies  Review of patient's allergies indicates no known allergies.  Home Medications   Current Outpatient Rx  Name  Route  Sig  Dispense   Refill  . acetaminophen (TYLENOL) 500 MG tablet   Oral   Take 500 mg by mouth every 6 (six) hours as needed for pain.         . promethazine (PHENERGAN) 25 MG tablet   Oral   Take 1 tablet (25 mg total) by mouth every 6 (six) hours as needed for nausea.   12 tablet   0     BP 146/70  Pulse 93  Temp(Src) 99.5 F (37.5 C) (Oral)  Resp 24  SpO2 98%  Physical Exam  Nursing note and vitals reviewed. Constitutional: He appears well-developed and well-nourished. No distress.  Pt sitting up in exam bed, appears well. NAD.  HENT:  Head: Normocephalic and atraumatic.  Eyes: Conjunctivae are normal. No scleral icterus.  Neck: Normal range of motion.  Cardiovascular: Normal rate, regular rhythm and normal heart sounds.   Pulmonary/Chest: Effort normal and breath sounds normal. No respiratory distress. He has no wheezes. He has no rales. He exhibits no tenderness.  Abdominal: Soft. Bowel sounds are normal. He exhibits no distension and no mass. There is tenderness ( RLQ, moderate). There is guarding ( mild). There is no rebound.  Musculoskeletal: Normal range of motion.  Neurological: He is alert.  Skin: Skin is warm and dry. He is not diaphoretic.    ED Course  Procedures (including critical care time)  Labs Reviewed - No data to display Dg Chest 2 View  12/03/2012   *RADIOLOGY REPORT*  Clinical Data:  Chest pain  and shortness of breath.  CHEST - 2 VIEW  Comparison: None  Findings: The heart size and mediastinal contours are within normal limits.  Both lungs are clear.  The visualized skeletal structures are unremarkable.  IMPRESSION: No active disease.   Original Report Authenticated By: Irish Lack, M.D.   Ct Abdomen Pelvis W Contrast  12/02/2012   *RADIOLOGY REPORT*  Clinical Data: Right lower quadrants abdominal pain.  CT ABDOMEN AND PELVIS WITH CONTRAST  Technique:  Multidetector CT imaging of the abdomen and pelvis was performed following the standard protocol during bolus  administration of intravenous contrast.  Contrast: OMNIPAQUE IOHEXOL 300 MG/ML  SOLN, 50mL OMNIPAQUE IOHEXOL 300 MG/ML  SOLN  Comparison: 08/15/2012  Findings: The appendix is difficult to visualize by CT due to crowding of bowel loops secondary to paucity of mesenteric fat. There clearly is no evidence of an inflammatory process in the right lower quadrant to suggest acute appendicitis by CT.  There is no evidence of bowel obstruction or free air.  The liver, gallbladder, pancreas, spleen, adrenal glands and kidneys are within normal limits.  No hernias, masses or enlarged lymph nodes are seen.  The bony structures are unremarkable.  IMPRESSION: No acute findings.  No evidence by CT of acute appendicitis.   Original Report Authenticated By: Irish Lack, M.D.    Date: 12/03/2012  Rate: 77  Rhythm: normal sinus rhythm  QRS Axis: normal  Intervals: normal  ST/T Wave abnormalities: normal  Conduction Disutrbances:none  Narrative Interpretation:   Old EKG Reviewed: none available    1. RLQ abdominal pain   2. Nausea & vomiting   3. Chest pain   4. Headache   5. Fever       MDM  Will obtain CXR and EKG to evaluate new onset chest pain and elevated temp to help r/o pneumonia. Full workup for RLQ pain was done yesterday, negative for acute appendicitis.  Pt states he spoke with general surgeon, but was too afraid to go through with surgery at this time.    EKG: nl   CXR: nl  No evidence of cardiopulmonary disease taking place at this time.  Discussed option of f/u with GI for further evaluation and possible other options besides surgery for ongoing symptoms.  Pt states he wants to go this route for now.  Rx: phenergan,  May take OTC acetaminophen and ibuprofen as needed for fever and pina.  F/u with [facility] and PCP, [place] [facility] info provided. Return precautions given. Pt verbalized understanding and agreement with tx plan. Vitals: unremarkable. Discharged in  stable condition.     Discussed pt with attending during ED encounter.      Junius Finner, PA-C 12/03/12 2001

## 2012-12-03 NOTE — ED Notes (Signed)
Epic down time-please refer to written Nursing notes.  Care per Cornelius Moras RN

## 2012-12-04 NOTE — ED Provider Notes (Signed)
Medical screening examination/treatment/procedure(s) were conducted as a shared visit with non-physician practitioner(s) and myself.  I personally evaluated the patient during the encounter  Toy Baker, MD 12/04/12 1316

## 2012-12-07 ENCOUNTER — Encounter (HOSPITAL_COMMUNITY): Payer: Self-pay | Admitting: Emergency Medicine

## 2012-12-07 ENCOUNTER — Emergency Department (HOSPITAL_COMMUNITY)
Admission: EM | Admit: 2012-12-07 | Discharge: 2012-12-07 | Disposition: A | Discharge status: Home / Self Care | Payer: Self-pay | Attending: Emergency Medicine | Admitting: Emergency Medicine

## 2012-12-07 DIAGNOSIS — R197 Diarrhea, unspecified: Secondary | ICD-10-CM | POA: Insufficient documentation

## 2012-12-07 DIAGNOSIS — R109 Unspecified abdominal pain: Secondary | ICD-10-CM

## 2012-12-07 DIAGNOSIS — R6883 Chills (without fever): Secondary | ICD-10-CM | POA: Insufficient documentation

## 2012-12-07 DIAGNOSIS — R1084 Generalized abdominal pain: Secondary | ICD-10-CM | POA: Insufficient documentation

## 2012-12-07 DIAGNOSIS — R112 Nausea with vomiting, unspecified: Secondary | ICD-10-CM | POA: Insufficient documentation

## 2012-12-07 DIAGNOSIS — R42 Dizziness and giddiness: Secondary | ICD-10-CM | POA: Insufficient documentation

## 2012-12-07 LAB — COMPREHENSIVE METABOLIC PANEL
AST: 16 U/L (ref 0–37)
Albumin: 4.1 g/dL (ref 3.5–5.2)
CO2: 28 mEq/L (ref 19–32)
Calcium: 10.2 mg/dL (ref 8.4–10.5)
Glucose, Bld: 112 mg/dL — ABNORMAL HIGH (ref 70–99)
Potassium: 3.6 mEq/L (ref 3.5–5.1)
Sodium: 139 mEq/L (ref 135–145)
Total Bilirubin: 0.3 mg/dL (ref 0.3–1.2)

## 2012-12-07 LAB — CBC WITH DIFFERENTIAL/PLATELET
Lymphocytes Relative: 31 % (ref 12–46)
Lymphs Abs: 2.8 10*3/uL (ref 0.7–4.0)
Neutrophils Relative %: 57 % (ref 43–77)
Platelets: 244 10*3/uL (ref 150–400)
RBC: 5.33 MIL/uL (ref 4.22–5.81)
WBC: 9 10*3/uL (ref 4.0–10.5)

## 2012-12-07 LAB — URINALYSIS, ROUTINE W REFLEX MICROSCOPIC
Glucose, UA: NEGATIVE mg/dL
Ketones, ur: NEGATIVE mg/dL
Leukocytes, UA: NEGATIVE
Nitrite: NEGATIVE
Protein, ur: NEGATIVE mg/dL
pH: 7 (ref 5.0–8.0)

## 2012-12-07 LAB — CG4 I-STAT (LACTIC ACID): Lactic Acid, Venous: 1.16 mmol/L (ref 0.5–2.2)

## 2012-12-07 LAB — LIPASE, BLOOD: Lipase: 13 U/L (ref 11–59)

## 2012-12-07 MED ORDER — ONDANSETRON HCL 4 MG/2ML IJ SOLN
4.0000 mg | Freq: Once | INTRAMUSCULAR | Status: AC
  Administered 2012-12-07: 4 mg via INTRAVENOUS
  Filled 2012-12-07: qty 2

## 2012-12-07 MED ORDER — SODIUM CHLORIDE 0.9 % IV BOLUS (SEPSIS)
1000.0000 mL | Freq: Once | INTRAVENOUS | Status: AC
  Administered 2012-12-07: 1000 mL via INTRAVENOUS

## 2012-12-07 MED ORDER — ONDANSETRON HCL 4 MG PO TABS: 4.0000 mg | ORAL_TABLET | Freq: Four times a day (QID) | ORAL | Status: DC

## 2012-12-07 MED ORDER — MORPHINE SULFATE 2 MG/ML IJ SOLN
2.0000 mg | Freq: Once | INTRAMUSCULAR | Status: AC
  Administered 2012-12-07: 2 mg via INTRAVENOUS
  Filled 2012-12-07: qty 1

## 2012-12-07 NOTE — ED Notes (Signed)
Patient states that he was out in the heat when he started to feel bad about a week and an half ago. The patient continues to have N/V/ D and abdominal pain

## 2012-12-07 NOTE — Discharge Instructions (Signed)
Please call and set-up an appointment with gastroenterology for further work-up of abdominal pain Please change diet and decrease fast food intake - for this may be the source and reason why abdominal pain has been continuing Please follow clear diet Please take medications as prescribed Please continue to monitor symptoms and of symptoms are to worsen or change (fever > 100, chills, sweating worsening pain, changes to pain, diarrhea, vomiting) please report back to the ED.   Abdominal Pain Abdominal pain can be caused by many things. Your caregiver decides the seriousness of your pain by an examination and possibly blood tests and X-rays. Many cases can be observed and treated at home. Most abdominal pain is not caused by a disease and will probably improve without treatment. However, in many cases, more time must pass before a clear cause of the pain can be found. Before that point, it may not be known if you need more testing, or if hospitalization or surgery is needed. HOME CARE INSTRUCTIONS   Do not take laxatives unless directed by your caregiver.  Take pain medicine only as directed by your caregiver.  Only take over-the-counter or prescription medicines for pain, discomfort, or fever as directed by your caregiver.  Try a clear liquid diet (broth, tea, or water) for as long as directed by your caregiver. Slowly move to a bland diet as tolerated. SEEK IMMEDIATE MEDICAL CARE IF:   The pain does not go away.  You have a fever.  You keep throwing up (vomiting).  The pain is felt only in portions of the abdomen. Pain in the right side could possibly be appendicitis. In an adult, pain in the left lower portion of the abdomen could be colitis or diverticulitis.  You pass bloody or black tarry stools. MAKE SURE YOU:   Understand these instructions.  Will watch your condition.  Will get help right away if you are not doing well or get worse. Document Released: 03/21/2005 Document  Revised: 09/03/2011 Document Reviewed: 01/28/2008 [facility] Patient Information 2014 [place], [place]. Clear Liquid Diet The clear liquid dietconsists of foods that are liquid or will become liquid at room temperature.You should be able to see through the liquid and beverages. Examples of foods allowed on a clear liquid diet include fruit juice, broth or bouillon, gelatin, or frozen ice pops. The purpose of this diet is to provide necessary fluid, electrolytes such as sodium and potassium, and energy to keep the body functioning during times when you are not able to consume a regular diet.A clear liquid diet should not be continued for long periods of time as it is not nutritionally adequate.  REASONS FOR USING A CLEAR LIQUID DIET  In sudden onset (acute) conditions for a patient before or after surgery.  As the first step in oral feeding.  For fluid and electrolyte replacement in diarrheal diseases.  As a diet before certain medical tests are performed. ADEQUACY The clear liquid diet is adequate only in ascorbic acid, according to the Recommended Dietary Allowances of the [employer]. CHOOSING FOODS Breads and Starches  Allowed:  None are allowed.  Avoid: All are avoided. Vegetables  Allowed:  Strained tomato or vegetable juice.  Avoid: Any others. Fruit  Allowed:  Strained fruit juices and fruit drinks. Include 1 serving of citrus or vitamin C-enriched fruit juice daily.  Avoid: Any others. Meat and Meat Substitutes  Allowed:  None are allowed.  Avoid: All are avoided. Milk  Allowed:  None are allowed.  Avoid: All are  avoided. Soups and Combination Foods  Allowed:  Clear bouillon, broth, or strained broth-based soups.  Avoid: Any others. Desserts and Sweets  Allowed:  Sugar, honey. High protein gelatin. Flavored gelatin, ices, or frozen ice pops that do not contain milk.  Avoid: Any others. Fats and Oils  Allowed:  None are allowed.  Avoid: All  are avoided. Beverages  Allowed: Cereal beverages, coffee (regular or decaffeinated), tea, or soda at the discretion of your caregiver.  Avoid: Any others. Condiments  Allowed:  Iodized salt.  Avoid: Any others, including pepper. Supplements  Allowed:  Liquid nutrition beverages.  Avoid: Any others that contain lactose or fiber. SAMPLE MEAL PLAN Breakfast  4 oz (120 mL) strained orange juice.   to 1 cup (125 to 250 mL) gelatin (plain or fortified).  1 cup (250 mL) beverage (coffee or tea).  Sugar, if desired. Midmorning Snack   cup (125 mL) gelatin (plain or fortified). Lunch  1 cup (250 mL) broth or consomm.  4 oz (120 mL) strained grapefruit juice.   cup (125 mL) gelatin (plain or fortified).  1 cup (250 mL) beverage (coffee or tea).  Sugar, if desired. Midafternoon Snack   cup (125 mL) fruit ice.   cup (125 mL) strained fruit juice. Dinner  1 cup (250 mL) broth or consomm.   cup (125 mL) cranberry juice.   cup (125 mL) flavored gelatin (plain or fortified).  1 cup (250 mL) beverage (coffee or tea).  Sugar, if desired. Evening Snack  4 oz (120 mL) strained apple juice (vitamin C-fortified).   cup (125 mL) flavored gelatin (plain or fortified). Document Released: 06/11/2005 Document Revised: 09/03/2011 Document Reviewed: 09/08/2010 [facility] Patient Information 2014 [place], [place].  RESOURCE GUIDE  Chronic Pain Problems: Contact [facility]  [phone] Patients need to be referred by their primary care doctor.  Insufficient Money for Medicine: Contact United Way:  call "211."   No Primary Care Doctor: - Call Health Connect  [phone] - can help you locate a primary care doctor that  accepts your insurance, provides certain services, etc. - Physician Referral Service- [phone]  Agencies that provide inexpensive medical care: - [facility]  [phone] - [facility]   [phone] - [facility]  [phone] - Women's Clinic  [phone] - Planned Parenthood  [phone] [facility]  [phone]  Medicaid-accepting [facility] Providers: - [facility]- [address]  [phone], Mon-Fri 9am-7pm, Sat 9am-1pm - [facility]- [address] [place], Suite [place]  [phone] - [facility]- [address] [place]  [phone] [facility] Family [facility]- [address]  [phone] - Renaye Rakers- [address] [place], [address], [phone]  Only accepts [place] patients after they have their name  applied to their card  Self Pay (no insurance) in [place]: - Sickle Cell Patients - [facility]  [address] [place], [phone] - [facility] Urgent Care- [address] [place]  [phone]       [facility] [place]- [address]       -     [facility]- see information above (Speak to [employer] if you do not have insurance)       -  [employer]- [address],  [phone]       -  [facility]- [address] [phone]       -  Dr Julio Sicks-  [address], [place], [phone]       -  Urgent Medical and [facility] - [address] [phone]       -  [facility]- [address] [phone], also [address], [phone]       -     [facility]- [address] [place], [phone], 1st & 3rd Saturday         every month, 10am-1pm  -     [facility]   [address] [place], [place].   Phone:  [phone], Fax:  [phone]. Hours of Operation:  9 am - 6 pm, M-F.  -     [facility]   [address], [place]   Phone: [phone], Fax: [phone]. Hours of Operation:  8:30 am - 5:30 pm, M-F.  [facility] [address] [place], [place]  [address] [phone]  [facility] [address] [place], [place] [address] [phone]  1) Find a Doctor and Pay Out of Pocket Although you won't have to find out who is covered by your insurance plan, it is a good idea to ask around and get recommendations. You will then need to call the office and see if the doctor you have chosen will accept you as a new patient and what types of options they offer for patients who are self-pay. Some doctors offer discounts or will set up payment plans for their patients who do not have insurance, but you will need to ask so you aren't surprised when you get to your appointment.  2) Contact Your Local Health Department Not all health departments have doctors that can see patients for sick visits, but many do, so it is worth a call to see if yours does. If you don't know where your local health department is, you can check in your phone book. The CDC also has a tool to help you locate your state's health department, and many state websites also have listings of all of their local health departments.  3) Find a Walk-in Clinic If your illness is not likely to be very severe or complicated, you may want to try a walk in clinic. These are popping up all over the country in pharmacies, drugstores, and shopping centers. They're usually staffed by nurse practitioners or physician assistants that have been trained to treat common illnesses and complaints. They're usually fairly quick and inexpensive. However, if you have serious medical issues or chronic medical problems, these are probably not your best option  STD Testing - [facility] of [facility], STD Clinic, [address], [place], phone [phone] or [phone].  Monday - Friday, call for an appointment. [facility] of [employer], STD Clinic, [address], [place], phone [phone] or [phone].  Monday - Friday, call for an  appointment.  Abuse/Neglect: [facility] Child Abuse Hotline 561-884-8243 [facility] Child Abuse Hotline [phone] (After Hours)  Emergency Shelter:  [facility] 8784500924  Maternity Homes: - Room at the [place] [facility] [phone] - [facility] [phone]  MRSA Hotline #:   223-826-3167  Dental Assistance If unable to pay or uninsured, contact:  [facility]. to  become qualified for the adult dental clinic.  Patients with Medicaid: [facility] [phone] [address], [phone] [address] [phone]  If unable to pay, or uninsured, contact [facility] [phone] in [place], [phone] in [facility]) to become qualified for the adult dental clinic  [facility] [address] [place], [place] [address] [phone] [email or web address]  Other Proofreader Services: - [facility]- [address] [place], [place], [place], [address], [phone], Ext. [phone], 2nd and 4th Thursday of the month at 6:30am.  10 clients each day by appointment, can sometimes see walk-in patients if someone does not show for an appointment. [facility]- [address] [place] [address] [place], [place], [address], [phone] - [facility] [address], [place], [place], [address], [phone] - [facility]- [phone] [facility] Health Department- [phone] [facility] Health [phone]       Behavioral Health Resources in the [facility]  Intensive Outpatient Programs: [facility]      [address] [place], [place] [phone] Both a day and evening program       [facility]     [address]        [place], [place] [address] [phone]         ADS: [facility] [address] [place] [phone]  [facility] Mental  Health ACCESS LINE: [phone] or [phone] [address] [place], [place] [address] [email or web address]   Substance Abuse Resources: - [facility] Drug [facility]  7624483950 - [facility] [phone] - [facility] [phone] Floydene Flock [phone] - Residential & Outpatient Substance Abuse Program  [phone]  Psychological Services: [facility]  [phone] [facility]  2390413400 - [facility], [phone] [place]. [address] [place], ACCESS LINE: [phone] or [phone], [email or web address]  Mobile Crisis Teams:                                        Therapeutic Alternatives         Mobile Crisis Care Unit [phone]             [facility] [address] Ginette Otto [phone]                                         Interventionist [address] [place] [address] [place] [phone]  Self-Help/Support Groups: Mental Health Assoc. of [employer] of support groups 925-653-6009 (call for more info)  Narcotics Anonymous (NA) Caring [facility] [address] [place] - 2 meetings at this location  Residential Treatment Programs:  [facility]      [address]        [place]       [phone]         [facility] [address] [place] [address] [place], [place]  [address] [phone]  [facility]  [address] [place] [address] [place], [place] [address] [phone] Admissions: 8am-3pm M-F  [facility]     [address]        [place], [place] [address]       [phone]  [facility] [address] [place], [place] [phone]  [facility] [address] [place], [place] [phone]  [facility] - Intensive Outpatient      [address]     [place], [place]       [phone]         [facility] ([facility]     [address] [place], [place] [phone] or [phone]  Residential Treatment Services (RTS), Medicaid [address] [place], [place] [phone]  Fellowship [address]                                               [address] [place] [phone]  [facility] Resources: [phone]               General Therapy                                                Angie Fava, PhD        [address] [place], [place] [address]         [phone]   Insurance  [facility]   [address] [place], [place] [address] [phone]  [facility] [address] [place], [place] [address] [phone] Insurance/Medicaid/sponsorship through [facility]                                              [address]                                        [place], [place] [address]    Therapy/tele-psych/case         [phone]          [facility] [address], [place]  [address]  Adolescent/group home/case management [phone]                                           Creola Corn PhD       General therapy       Insurance   [phone]         Dr. Lolly Mustache, [place], M-F [phone]  [facility] [place]  [employer] [facility]. [address]                 [address]         [address] [place] [address]  [address]  Cristobal Goldmann Phone:  [phone]                                  Phone:  [phone]                   Phone:  [phone]  [facility], [phone] - [facility] - [facility]- [phone]       -     [facility] in [place], [address],              [phone], [facility] Child Abuse Hotline 303-027-2522 or [phone] (After Hours)

## 2012-12-07 NOTE — ED Provider Notes (Signed)
History     CSN: [id number]  Arrival date & time 12/07/12  1800   First MD Initiated Contact with Patient 12/07/12 1850      Chief Complaint  Patient presents with  . Abdominal Pain  . Emesis  . Diarrhea    (Consider location/radiation/quality/duration/timing/severity/associated sxs/prior treatment) Patient is a 22 y.o. male presenting with abdominal pain, vomiting, and diarrhea. The history is provided by the patient. No language interpreter was used.  Abdominal Pain Associated symptoms include abdominal pain, chills, nausea and vomiting. Pertinent negatives include no arthralgias, chest pain, coughing, neck pain, numbness, sore throat or weakness.  Emesis Associated symptoms: abdominal pain, chills and diarrhea   Associated symptoms: no arthralgias and no sore throat   Diarrhea Associated symptoms: abdominal pain, chills and vomiting   Associated symptoms: no arthralgias   Keith Poole is a 22 y/o M presenting to the ED with abdominal pain that has been ongoing for the past week and a half - described as "feel like being stabbed," generalized discomfort without radiation - stated that the pain today has gotten worse. Reported that he has been using Vicodin, from his mother, and Tylenol for the discomfort without any relief. Stated that he has been feeling nauseous with 7 episodes of vomiting today, mainly of stomach contents - NB/NB. Stated that he has been having diarrhea, at least 2 episodes each day. Stated that he has been feeling chills, dizziness. Denied hematochezia, melena, chest pain, shortness of breath, difficulty breathing, numbness, tingling, neck pain, neck stiffness, fever.  Patient was recently seen in the ED on 12/03/2012 - has been the fourth time coming into the hospital for the same complaint. Stated that he called and tried to make an appointment with gastroenterology - stated that he cannot be seen by GI until August    History reviewed. No pertinent past  medical history.  Past Surgical History  Procedure Laterality Date  . Rotator cuff repair      History reviewed. No pertinent family history.  History  Substance Use Topics  . Smoking status: Never Smoker   . Smokeless tobacco: Not on file  . Alcohol Use: No      Review of Systems  Constitutional: Positive for chills.  HENT: Negative for sore throat, trouble swallowing, neck pain and neck stiffness.   Eyes: Negative for pain and visual disturbance.  Respiratory: Negative for cough, chest tightness and shortness of breath.   Cardiovascular: Negative for chest pain.  Gastrointestinal: Positive for nausea, vomiting, abdominal pain and diarrhea. Negative for constipation, blood in stool and anal bleeding.  Genitourinary: Negative for decreased urine volume, discharge, penile swelling, scrotal swelling, difficulty urinating, penile pain and testicular pain.  Musculoskeletal: Negative for back pain and arthralgias.  Neurological: Positive for dizziness. Negative for weakness and numbness.  All other systems reviewed and are negative.    Allergies  Review of patient's allergies indicates no known allergies.  Home Medications   Current Outpatient Rx  Name  Route  Sig  Dispense  Refill  . acetaminophen (TYLENOL) 500 MG tablet   Oral   Take 500 mg by mouth every 6 (six) hours as needed for pain.         . promethazine (PHENERGAN) 25 MG tablet   Oral   Take 1 tablet (25 mg total) by mouth every 6 (six) hours as needed for nausea.   12 tablet   0   . ondansetron (ZOFRAN) 4 MG tablet   Oral   Take 1  tablet (4 mg total) by mouth every 6 (six) hours.   12 tablet   0     BP 123/64  Pulse 65  Temp(Src) 98.6 F (37 C) (Oral)  Resp 18  Wt 157 lb (71.215 kg)  BMI 27.82 kg/m2  SpO2 100%  Physical Exam  Nursing note and vitals reviewed. Constitutional: He is oriented to person, place, and time. He appears well-developed and well-nourished. No distress.  HENT:  Head:  Normocephalic and atraumatic.  Eyes: Conjunctivae and EOM are normal. Pupils are equal, round, and reactive to light.  Neck: Normal range of motion. Neck supple.  Negative neck stiffness Negative nuchal rigidity  Cardiovascular: Normal rate, regular rhythm and normal heart sounds.  Exam reveals no friction rub.   No murmur heard. Pulses:      Radial pulses are 2+ on the right side, and 2+ on the left side.       Dorsalis pedis pulses are 2+ on the right side, and 2+ on the left side.  Pulmonary/Chest: Effort normal and breath sounds normal. No respiratory distress.  Abdominal: Soft. Bowel sounds are normal. He exhibits no distension. There is no hepatosplenomegaly. There is generalized tenderness. There is guarding and positive Murphy's sign. There is no rebound. No hernia.    Mild guarding noted upon palpation to the entire abdomen Positive Murphy's sign    Musculoskeletal: Normal range of motion.  Full ROM to upper and lower extremities Negative signs of weakness noted  Lymphadenopathy:    He has no cervical adenopathy.  Neurological: He is alert and oriented to person, place, and time. He exhibits normal muscle tone. Coordination normal.  Skin: Skin is warm and dry. No rash noted. He is not diaphoretic. No erythema.  Psychiatric: He has a normal mood and affect. His behavior is normal. Thought content normal.    ED Course  Procedures (including critical care time)  9:30PM Patient assessed - stated that pain went from above a 10 to 9/10.   11:00PM Checked on patient - patient reported that he has been feeling better. Discussed all labs with patient. Patient reported that he has been eating fast food everyday for a long time - [employer], [employer],  [employer], [employer] to name a few - stated that he gets them everyday for lunch. Discussed with patient that he needs to decrease intake of fast food to decrease fat and grease intake - discussed diet with patient. Discussed discharge plan -  patient agreed.   Labs Reviewed  CBC WITH DIFFERENTIAL - Abnormal; Notable for the following:    Monocytes Absolute 1.1 (*)    All other components within normal limits  COMPREHENSIVE METABOLIC PANEL - Abnormal; Notable for the following:    Glucose, Bld 112 (*)    All other components within normal limits  URINALYSIS, ROUTINE W REFLEX MICROSCOPIC - Abnormal; Notable for the following:    Specific Gravity, Urine 1.032 (*)    All other components within normal limits  LIPASE, BLOOD  CG4 I-STAT (LACTIC ACID)   No results found.   1. Abdominal pain       MDM  Patient presenting with abdominal pain that has been ongoing for the past week and a half. Has been seen in ED numerous times for similar complaint - 11/28/2012, 12/02/2012, 12/03/2012  CT abdomen and pelvis with contrast performed on 12/02/2012 - negative findings for acute inflammatory processes - no further imaging warranted at this time due to recent imaging performed.  Negative peritoneal and acute abdomen  signs identified on abdominal exam. CBC and CMP negative findings. Lipase negative elevation. Urine negative findings. Lactic acid negative elevation. Patient stated that he is feeling better. PO challenge tolerated with a [place] sandwich - patient able to eat half of the sandwich. Patient reported that he eats fast food everyday. Negative findings on labs and imaging to identify any acute inflammatory processes or acute abnormalities. Suspicion of abdominal pain to be due to poor diet with fast food everyday - as per patient. Negative emesis while in ED setting. Patient stable, afebrile. Discussed case with Dr. Dorris Carnes. [place] - cleared patient for discharge. Patient discharged. Discharged patient with antiemetics. Referred patient to [facility] and Gastroenterologist. Discussed with patient diet. Discussed with patient to continue to stay hydrated. Discussed with patient o monitor symptoms and if symptoms are to worsen  or change to report back to the ED - strict return instructions given. Patient agreed to plan of care, understood, all questions answered.           Raymon Mutton, PA-C 12/08/12 [phone]

## 2012-12-09 NOTE — ED Provider Notes (Signed)
Medical screening examination/treatment/procedure(s) were performed by non-physician practitioner and as supervising physician I was immediately available for consultation/collaboration.  Oswaldo Cueto R. Jobe Mutch, MD 12/09/12 2321 

## 2013-01-28 ENCOUNTER — Ambulatory Visit: Payer: Self-pay | Admitting: Internal Medicine

## 2013-01-28 DIAGNOSIS — Z0289 Encounter for other administrative examinations: Secondary | ICD-10-CM

## 2013-10-18 ENCOUNTER — Emergency Department (HOSPITAL_COMMUNITY)
Admission: EM | Admit: 2013-10-18 | Discharge: 2013-10-18 | Disposition: A | Discharge status: Home / Self Care | Payer: Self-pay | Attending: Emergency Medicine | Admitting: Emergency Medicine

## 2013-10-18 ENCOUNTER — Encounter (HOSPITAL_COMMUNITY): Payer: Self-pay | Admitting: Emergency Medicine

## 2013-10-18 DIAGNOSIS — F10229 Alcohol dependence with intoxication, unspecified: Secondary | ICD-10-CM | POA: Insufficient documentation

## 2013-10-18 DIAGNOSIS — F10929 Alcohol use, unspecified with intoxication, unspecified: Secondary | ICD-10-CM

## 2013-10-18 LAB — ETHANOL: ALCOHOL ETHYL (B): 305 mg/dL — AB (ref 0–11)

## 2013-10-18 NOTE — Discharge Instructions (Signed)
°Emergency Department Resource Guide °1) Find a Doctor and Pay Out of Pocket °Although you won't have to find out who is covered by your insurance plan, it is a good idea to ask around and get recommendations. You will then need to call the office and see if the doctor you have chosen will accept you as a new patient and what types of options they offer for patients who are self-pay. Some doctors offer discounts or will set up payment plans for their patients who do not have insurance, but you will need to ask so you aren't surprised when you get to your appointment. ° °2) Contact Your Local Health Department °Not all health departments have doctors that can see patients for sick visits, but many do, so it is worth a call to see if yours does. If you don't know where your local health department is, you can check in your phone book. The CDC also has a tool to help you locate your state's health department, and many state websites also have listings of all of their local health departments. ° °3) Find a Walk-in Clinic °If your illness is not likely to be very severe or complicated, you may want to try a walk in clinic. These are popping up all over the country in pharmacies, drugstores, and shopping centers. They're usually staffed by nurse practitioners or physician assistants that have been trained to treat common illnesses and complaints. They're usually fairly quick and inexpensive. However, if you have serious medical issues or chronic medical problems, these are probably not your best option. ° °No Primary Care Doctor: °- Call [facility] at  832-8000 - they can help you locate a primary care doctor that  accepts your insurance, provides certain services, etc. °- Physician Referral Service- 1-800-533-3463 ° °Chronic Pain Problems: °Organization         Address  Phone   Notes  °[facility]  (336) [phone] Patients need to be referred by their primary care doctor.  ° °Medication  Assistance: °Organization         Address  Phone   Notes  °[place] [facility] [address] °[place], [place] [address] (336) 641-8030 --Must be a resident of [place] °-- Must have NO insurance coverage whatsoever (no Medicaid/ Medicare, etc.) °-- The pt. MUST have a primary care doctor that directs their care regularly and follows them in the community °  °[facility]  (866) 331-1348   °[employer]  (888) 892-1162   ° °Agencies that provide inexpensive medical care: °Organization         Address  Phone   Notes  °[facility]  (336) 832-8035   ° Internal Medicine    (336) 832-7272   °[facility] [address] °[place], [place] [address] (336) 832-4777   °[facility] [address], °[place] (336) 271-4999   °[facility]    (336) 373-0678   °[facility]    (336) 272-1050   °[facility] ° [address], [place] Phone:  (336) 832-4444, Fax:  (336) 832-4440 Hours of Operation:  9 am - 6 pm, M-F.  Also accepts Medicaid/Medicare and self-pay.  °[facility] ° [address], [place] Phone: (336) 832-3150, Fax: (336) 832-3151. Hours of Operation:  8:30 am - 5:30 pm, M-F.  Also accepts Medicaid and self-pay.  °[facility] [place] [address], [place] Phone: (336) 878-6027   °[facility] [address], [place], [place] (336)723-1848, Ext. 123 Mondays & Thursdays: 7-9 AM.  First 15 patients are seen on a first come, first serve basis. °  ° °Medicaid-accepting [place] Providers: ° °Organization         Address  Phone   Notes  °[facility] [address], [place] (336) 641-2100 Also accepts self-pay patients.  °[facility] [address], [place] ° (336) 856-9996   °[facility] [address], [place]  (336) 288-8857   °[facility] [address] [place] [address], [place] (336) 299-7000   °Veita Bland [address], [place]  ° (336) 373-1557 Only accepts [place] Access Medicaid patients after they have their name applied to their card.  ° °Self-Pay (no insurance) in [place]: ° °Organization         Address  Phone   Notes  °Sickle Cell Patients, [facility] [address], [place] (336) 832-1970   °[facility] [address], [place] (336) 832-4400   °[facility] ° [address], [place] (336) 992-4800   °[facility]/Dr. Osei-Bonsu ° [address] [place] [address], [place] or [address], [place] (336) 841-8500 Phone number for both [place] and [place] locations is the same.  °[facility] [address], [place] (336) 299-0000   °[facility] [address] [place] [address], [place] or [address] (336) 852-7530 °(336) 878-2260   °[facility] [address], [place] (336) 350-1642, phone; (336) 294-5005, fax Sees patients 1st and 3rd Saturday of every month.  Must not qualify for public or private insurance (i.e. Medicaid, Medicare, [place] Health Choice, Veterans' Benefits) • Household income should be no more than 200% of the poverty level •The clinic cannot treat you if you are pregnant or think you are pregnant • Sexually transmitted diseases are not treated at the clinic.  ° ° °Dental Care: °Organization         Address  Phone  Notes  °[place] [facility] [address], [place] (336) 641-6152 Accepts children up to age 21 who are enrolled in Medicaid or [place] Health Choice; pregnant women with a Medicaid card; and children who have applied for Medicaid or [place] Health Choice, but were declined, whose parents can pay a reduced fee at time of service.  °[place]  [facility] [place]  [address], [place] (336) 641-7733 Accepts children up to age 21 who are enrolled in Medicaid or [place] Health Choice; pregnant women with a Medicaid card; and children who have applied for Medicaid or [place] Health Choice, but were declined, whose parents can pay a reduced fee at time of service.  °[facility] ° [address], [place] (336) 641-4533 Patients are seen by appointment only. Walk-ins are not accepted. [facility] will see patients 18 years of age and older. °Monday - Tuesday (8am-5pm) °Most Wednesdays (8:30-5pm) °$30 per visit, cash only  °[facility] ° [address], [place] (336) 641-4533 Patients are seen by appointment only. Walk-ins are not accepted. [facility] will see patients 18 years of age and older. °One   Wednesday Evening (Monthly: Volunteer Based).  $30 per visit, cash only  °[facility]  (919) 537-3737 for adults; Children under age 4, call Graduate Pediatric Dentistry at (919) [phone]. Children aged 4-14, please call (919) [phone] to request a pediatric application. ° Dental services are provided in all areas of dental care including fillings, crowns and bridges, complete and partial dentures, implants, gum treatment, root canals, and extractions. Preventive care is also provided. Treatment is provided to both adults and children. °Patients are selected via a lottery and there is often a waiting list. °  °[facility] [address], °[place] ° (336) 763-8833 [email or web address] °  °[facility] [address], [place], [place] (336)723-1848, Ext. 123 Second and Fourth Thursday of each month, opens at 6:30 AM; Clinic ends at 9 AM.  Patients are seen on a first-come first-served basis, and a limited number are seen during each clinic.  ° °[facility] ° [address], [place], [place] (336) 723-7904    Eligibility Requirements °You must have lived in [place], [place], or [place] counties for at least the last three months. °  You cannot be eligible for state or federal sponsored healthcare insurance, including Veterans Administration, Medicaid, or Medicare. °  You generally cannot be eligible for healthcare insurance through your employer.  °  How to apply: °Eligibility screenings are held every Tuesday and Wednesday afternoon from 1:00 pm until 4:00 pm. You do not need an appointment for the interview!  °[facility] [address], [place], [place] 336-631-2330   °[facility] [facility]  336-342-8273   °[facility]  336-703-3100   °[facility]  336-570-6415   ° °Behavioral Health Resources in the Community: °Intensive Outpatient Programs °Organization         Address  Phone  Notes  °[facility] [address], [place], [place] 336-878-6098   °[facility] [address], [place], [place] 336-832-9800   °[facility] [address], [place], [place] ° 336-882-2125   °[facility] [address],  °[place], [place] 1-800-853-5163 or 336-641-4981   °Substance Abuse Resources °Organization         Address  Phone  Notes  °[facility]  336-882-2125   °[facility]  336-784-9470   °[facility]  336-285-9073   °[facility]  336-845-3988   °Residential & Outpatient Substance Abuse Program  1-800-659-3381   °Psychological Services °Organization         Address  Phone  Notes  °[facility]  336- 832-9600   °[facility]  336- 378-7881   °[facility] [address], [place] 1-800-853-5163 or 336-641-4981   ° °Mobile Crisis Teams °Organization         Address  Phone  Notes  °[facility], Mobile Crisis Care Unit  1-877-626-1772   °Assertive °[facility] ° [address]  [place], [place] 336-834-9664   °Sharon DeEsch [address] °[place] 336-554-5454   ° °Self-Help/Support Groups °Organization         Address  Phone             Notes  °[facility] - variety of support groups  336- 373-1402 Call for more information  °Narcotics Anonymous (NA), [facility] [address], °[place]  2 meetings at this location  ° °  Residential Treatment Programs °Organization         Address  Phone  Notes  °[facility] [address],    °[place]  1-866-801-8205   °[facility] ° [address], [place], [place] 704-293-8524   °[facility] [address], [place] 336-845-3988 Admissions: 8am-3pm M-F  °[facility] [address]    °[place], [place] 336-841-1104   °[facility] [address], [place], [place] 336-379-7146   °[facility] [address]  °[place], [place] 336-285-9073   °[facility] - Intensive Outpatient [address], [place], [place] 336-852-3033   °[facility] [address]  °[place], [place] 1-877-615-2722 or 336-784-9470   °[facility] [address] [place], [place] 336-227-7417 Accepts Medicaid  °[facility] [address]  ° [place] 1-800-659-3381 Substance Abuse/Addiction Treatment  ° °[place] Behavioral Health Resources °Organization         Address  Phone  Notes  °[facility]  (888) 581-9988   °Julie Brannon, PhD [address] [place], [place]   (336) 349-5553 or (336) 951-0000   °[facility]   [address] °[place], [place] (336) 349-4454   °[facility] [address], [place], [place] (336) 342-8316 Insurance/Medicaid/sponsorship through [facility]  °[facility] [address]                                    [place], [place] (336) 342-8316 Therapy/tele-psych/case    °[facility] [address]  ° [place],  (336) 349-2233    °Dr. Arfeen  (336) 349-4544   °[facility] [place]  [employer] [place] [facility] 1) [address], [place] °2) [address], [place] °3)  [address], [place] (336) 349-3220 °(336) 342-7768 ° °(336) 342-8140   °[place] Child Abuse [facility] (336) 342-1394 or (336) 342-3537 (After Hours)    ° ° °
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
## 2013-10-18 NOTE — ED Notes (Signed)
Discharge instructions reviewed. Pt verbalized understanding.  

## 2013-10-18 NOTE — ED Notes (Signed)
Pt ambulated to restroom with no assistance. ?

## 2014-11-27 IMAGING — CT CT ABD-PELV W/ CM
1 of 2 series · 16 of 32 positions shown, 20 images · IV contrast (OMNIPAQUE 300)
Comparison: 08/15/2012

CLINICAL DATA: Right lower quadrants abdominal pain.

CT ABDOMEN AND PELVIS WITH CONTRAST
TECHNIQUE: Multidetector CT imaging of the abdomen and pelvis was
performed following the standard protocol during bolus
administration of intravenous contrast.
Contrast: 100mL OMNIPAQUE IOHEXOL 300 MG/ML  SOLN, 50mL OMNIPAQUE
IOHEXOL 300 MG/ML  SOLN

[Series 2: abd/pel with · axial · 0.85mm/px · z∈[-396,+28]mm · 16 of 93 slices shown, 20 images]
[im 4/93  soft-tissue]
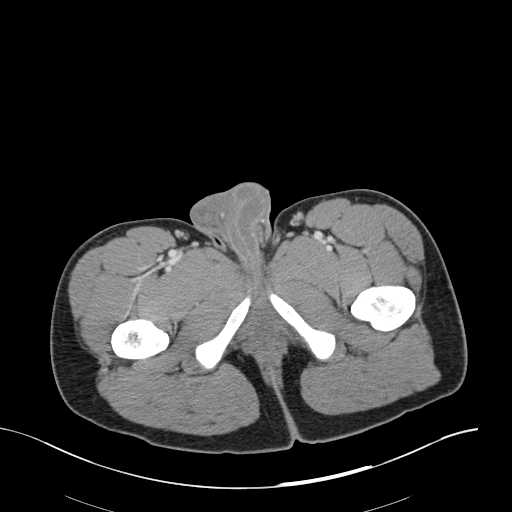
[im 4/93  bone]
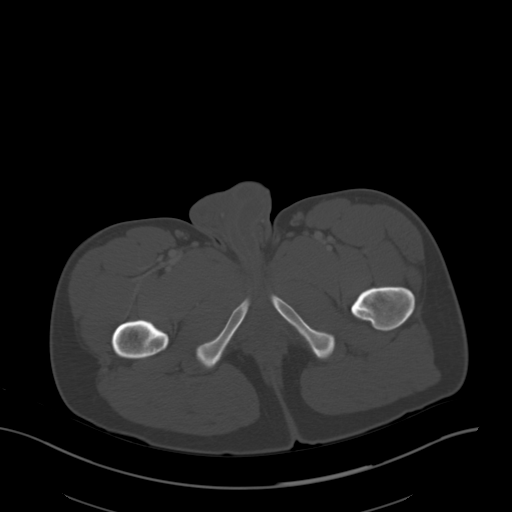
[im 12/93  soft-tissue]
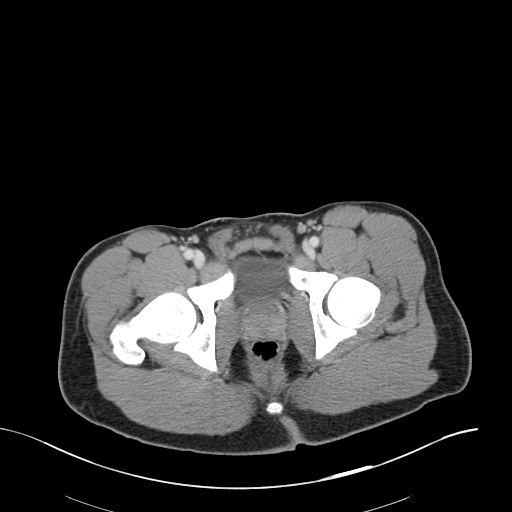
[im 20/93  soft-tissue]
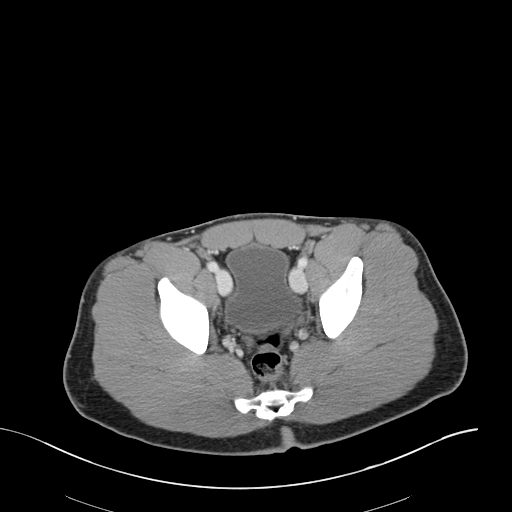
[im 24/93  soft-tissue]
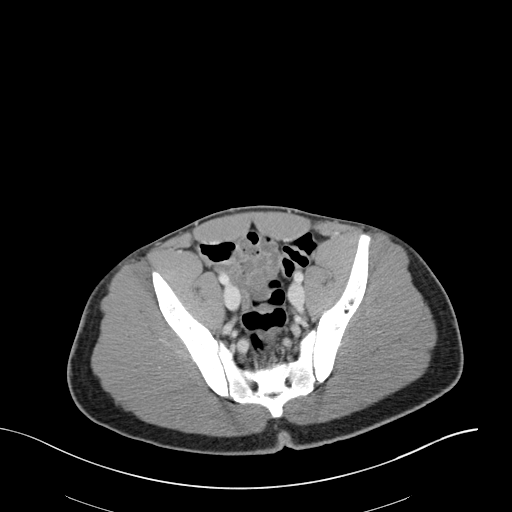
[im 31/93  soft-tissue]
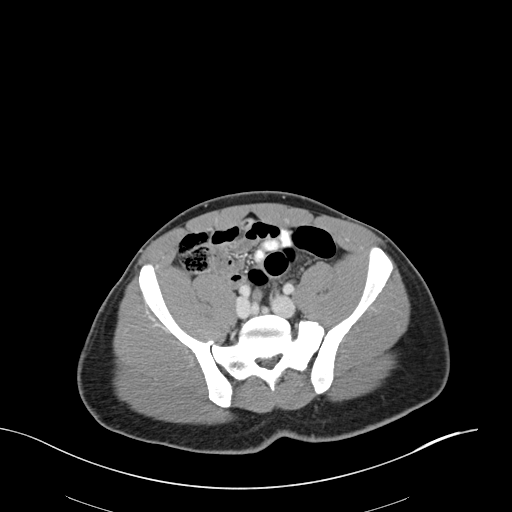
[im 39/93  soft-tissue]
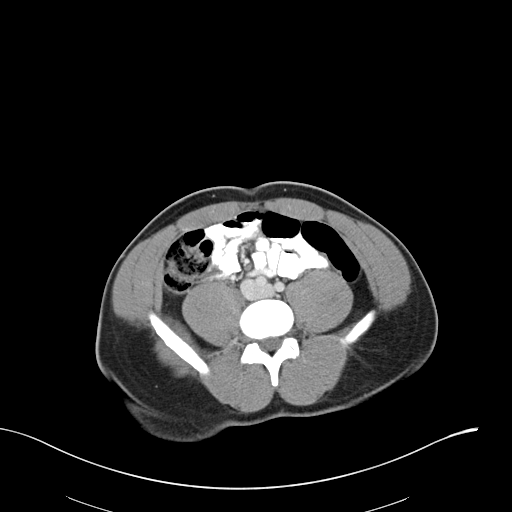
[im 43/93  soft-tissue]
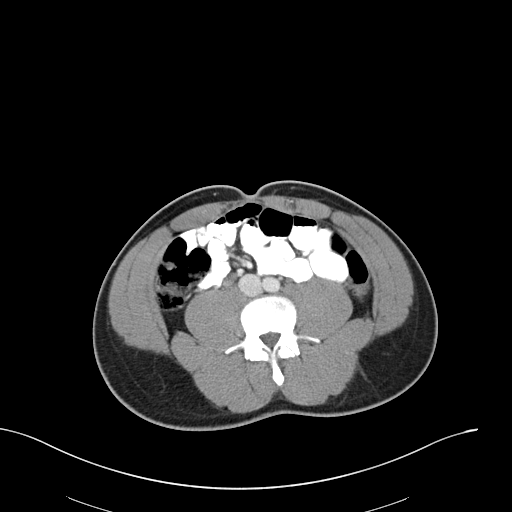
[im 50/93  soft-tissue]
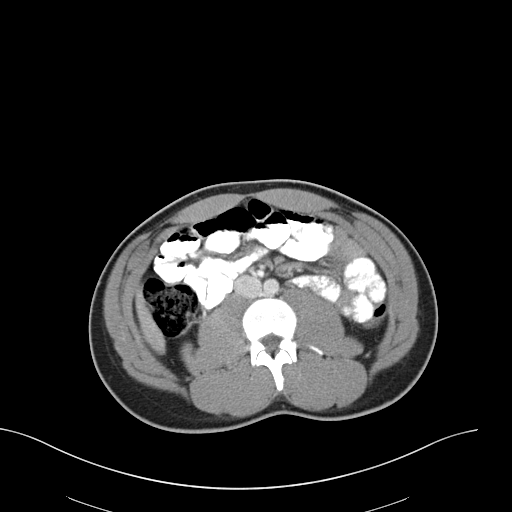
[im 54/93  soft-tissue]
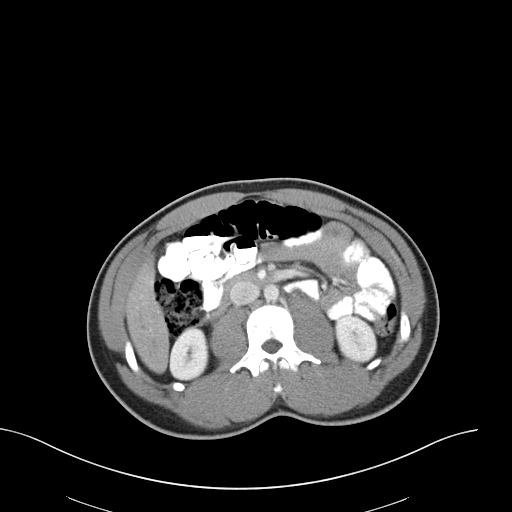
[im 54/93  bone]
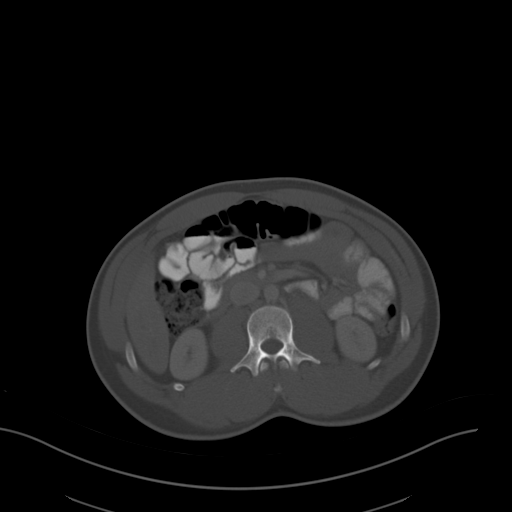
[im 62/93  soft-tissue]
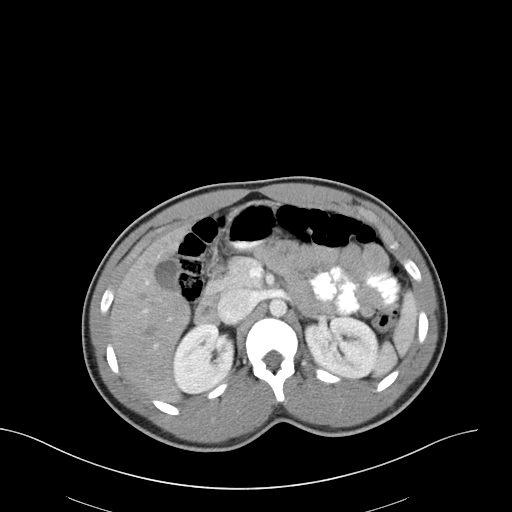
[im 70/93  soft-tissue]
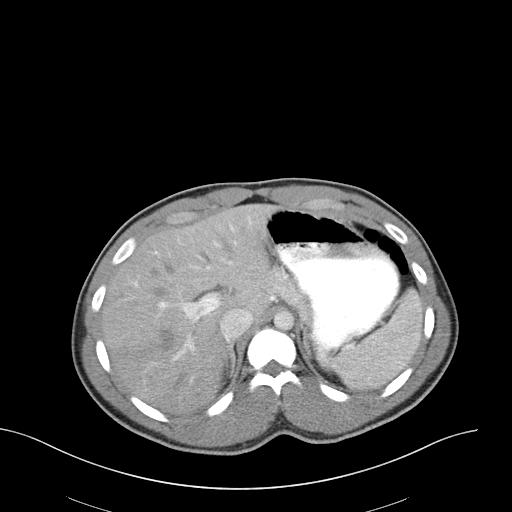
[im 73/93  soft-tissue]
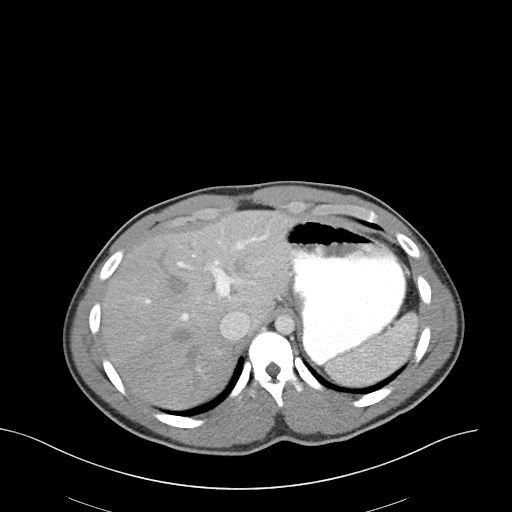
[im 77/93  lung]
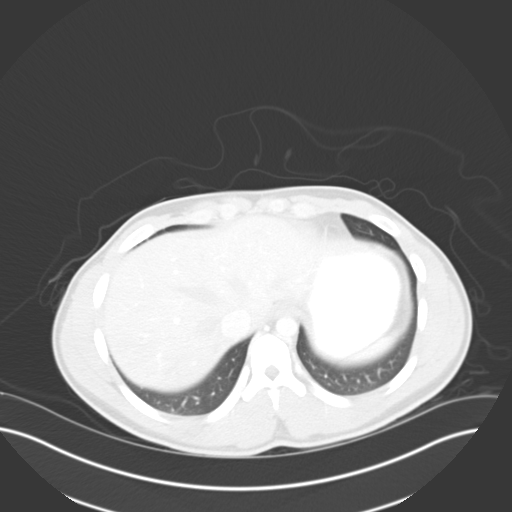
[im 81/93  soft-tissue]
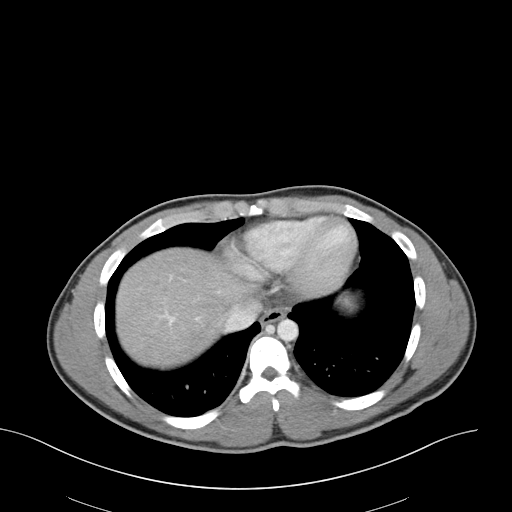
[im 81/93  lung]
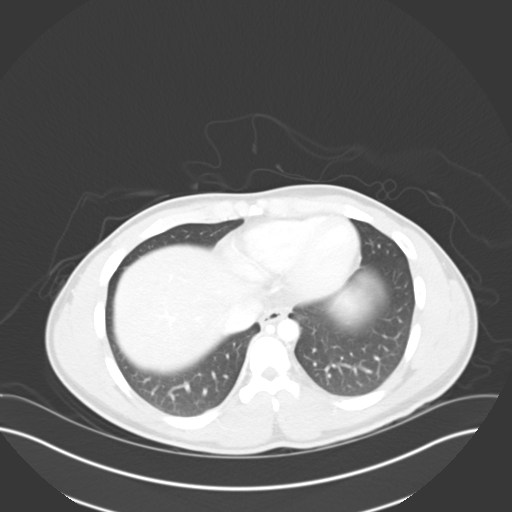
[im 85/93  lung]
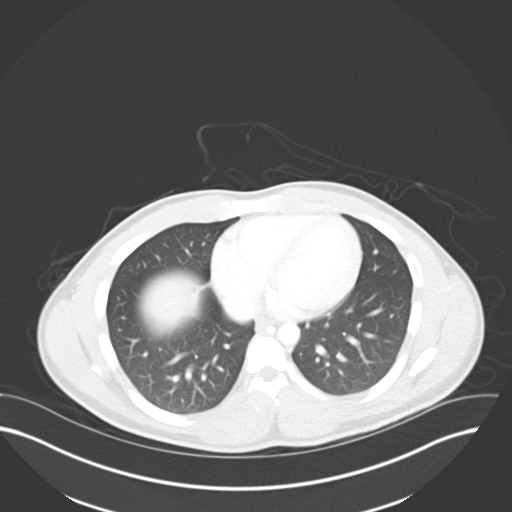
[im 89/93  soft-tissue]
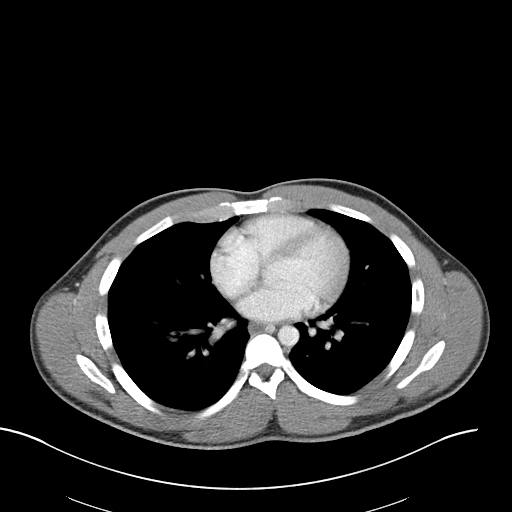
[im 89/93  lung]
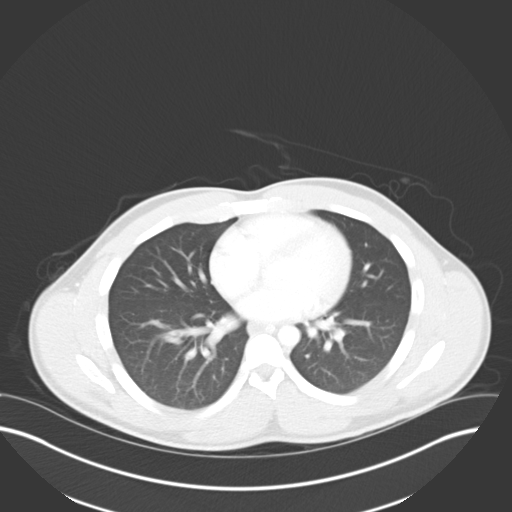

[16 of 32 positions shown; findings below may reference images not displayed]

FINDINGS: The appendix is difficult to visualize by CT due to
crowding of bowel loops secondary to paucity of mesenteric fat.
There clearly is no evidence of an inflammatory process in the
right lower quadrant to suggest acute appendicitis by CT.

There is no evidence of bowel obstruction or free air.  The liver,
gallbladder, pancreas, spleen, adrenal glands and kidneys are
within normal limits.  No hernias, masses or enlarged lymph nodes
are seen.  The bony structures are unremarkable.
IMPRESSION: No acute findings.  No evidence by CT of acute appendicitis.

## 2014-11-28 IMAGING — CR DG CHEST 2V
2 series · 2 of 2 positions shown · non-contrast
Comparison: None

CLINICAL DATA: Chest pain and shortness of breath.

CHEST - 2 VIEW

[w chest pa]
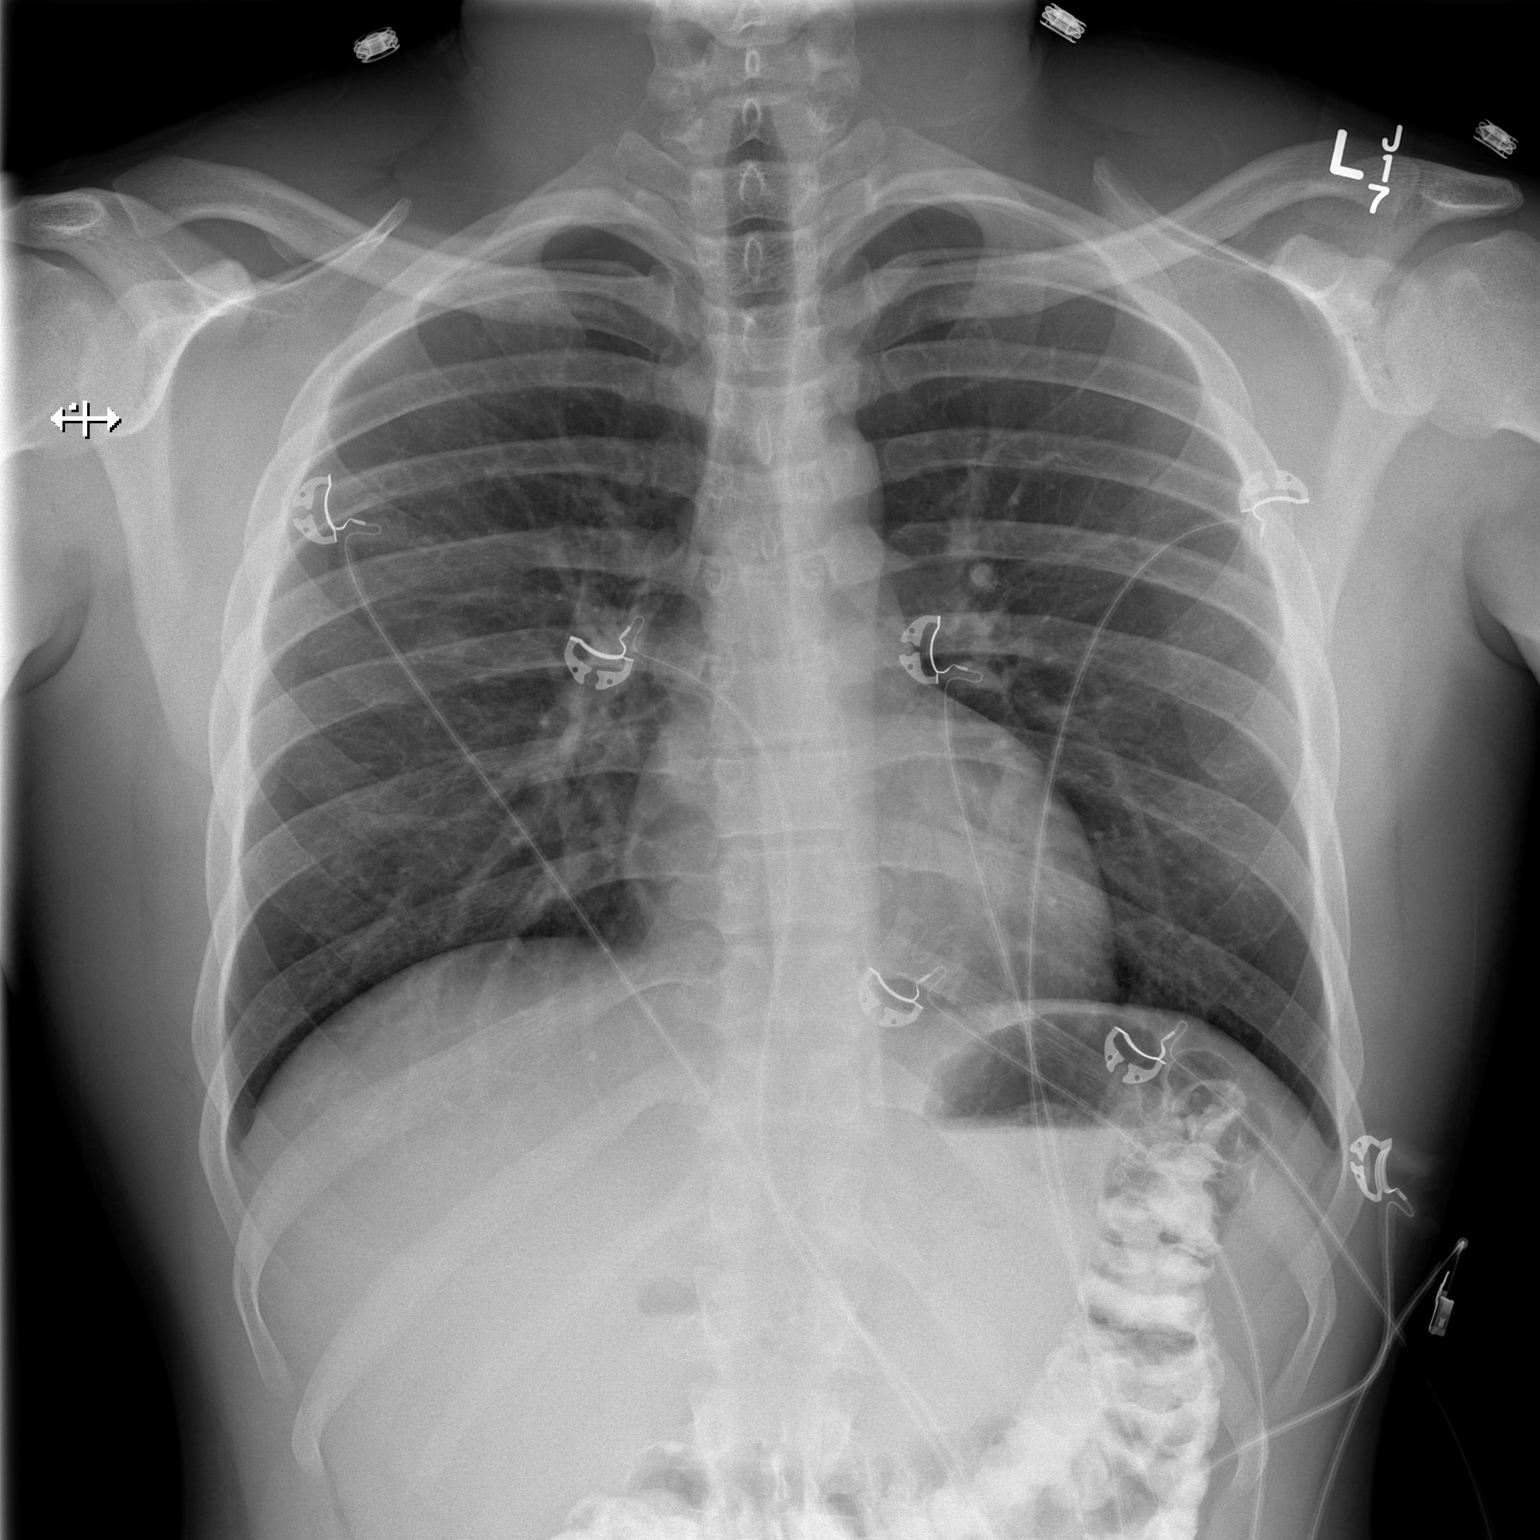

[w chest lat]
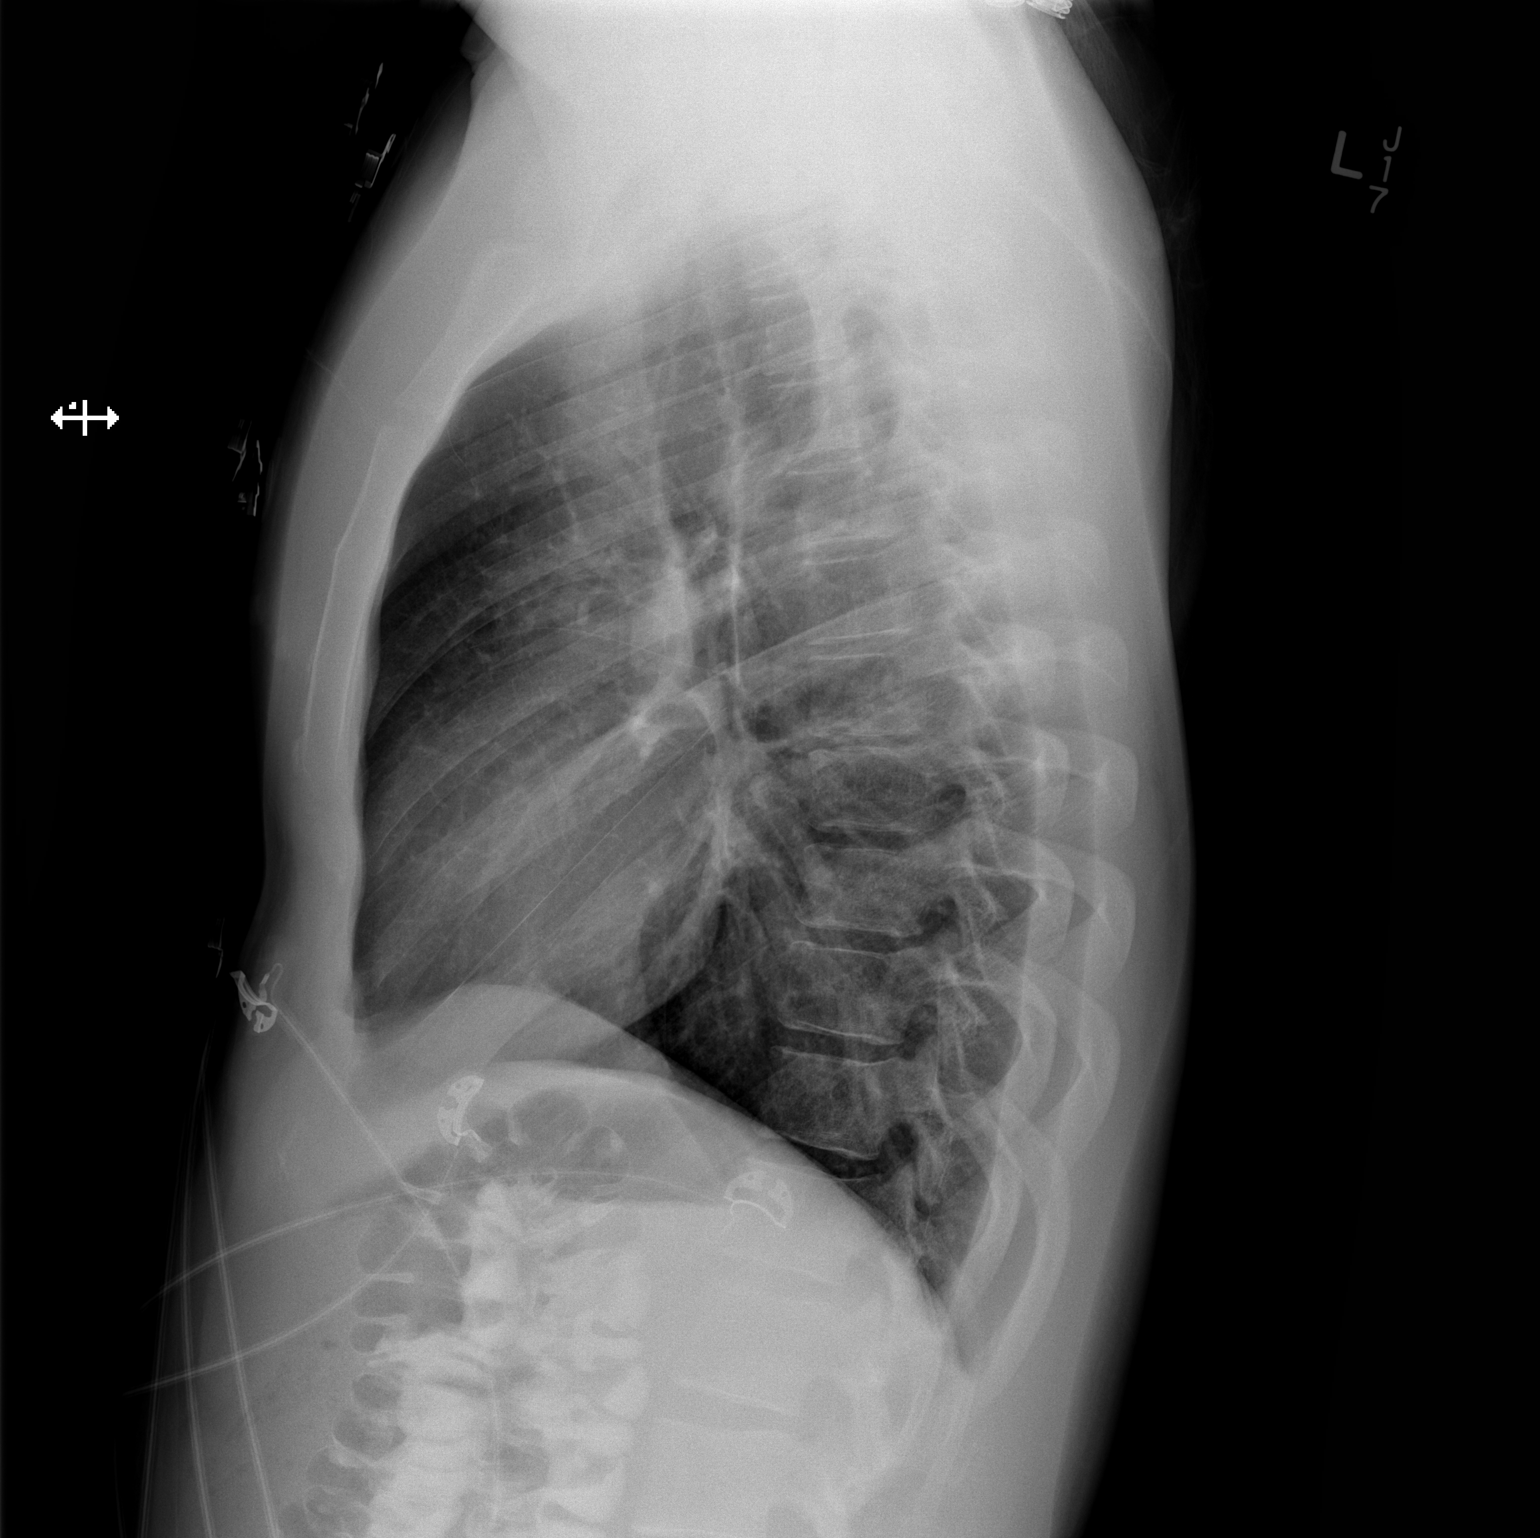

[2 of 2 positions shown; findings below may reference images not displayed]

FINDINGS: The heart size and mediastinal contours are within normal
limits.  Both lungs are clear.  The visualized skeletal structures
are unremarkable.
IMPRESSION: No active disease.

## 2016-08-19 ENCOUNTER — Ambulatory Visit (HOSPITAL_COMMUNITY)
Admission: EM | Admit: 2016-08-19 | Discharge: 2016-08-19 | Disposition: A | Discharge status: Home / Self Care | Payer: BLUE CROSS/BLUE SHIELD | Attending: Family Medicine | Admitting: Family Medicine

## 2016-08-19 ENCOUNTER — Encounter (HOSPITAL_COMMUNITY): Payer: Self-pay | Admitting: *Deleted

## 2016-08-19 DIAGNOSIS — Z202 Contact with and (suspected) exposure to infections with a predominantly sexual mode of transmission: Secondary | ICD-10-CM

## 2016-08-19 DIAGNOSIS — A549 Gonococcal infection, unspecified: Secondary | ICD-10-CM

## 2016-08-19 MED ORDER — AZITHROMYCIN 250 MG PO TABS
1000.0000 mg | ORAL_TABLET | Freq: Once | ORAL | Status: AC
  Administered 2016-08-19: 1000 mg via ORAL

## 2016-08-19 MED ORDER — CEFTRIAXONE SODIUM 250 MG IJ SOLR
INTRAMUSCULAR | Status: AC
  Filled 2016-08-19: qty 250

## 2016-08-19 MED ORDER — LIDOCAINE HCL (PF) 1 % IJ SOLN
INTRAMUSCULAR | Status: AC
  Filled 2016-08-19: qty 2

## 2016-08-19 MED ORDER — CEFTRIAXONE SODIUM 250 MG IJ SOLR
250.0000 mg | Freq: Once | INTRAMUSCULAR | Status: AC
  Administered 2016-08-19: 250 mg via INTRAMUSCULAR

## 2016-08-19 MED ORDER — AZITHROMYCIN 250 MG PO TABS
ORAL_TABLET | ORAL | Status: AC
  Filled 2016-08-19: qty 4

## 2016-08-19 NOTE — ED Triage Notes (Signed)
C/O penile discharge x 1 wk.  Partner tested positive for gonorrhea.

## 2016-08-19 NOTE — ED Provider Notes (Signed)
[facility]    CSN: [id number] Arrival date & time: 08/19/16  1322     History   Chief Complaint Chief Complaint  Patient presents with  . Penile Discharge    HPI Keith Poole is a 26 y.o. male.   This is a 26 year old man who presents with 1 week of discharge from his penis. His partner tested positive for gonorrhea. He denies other symptoms such as sore throat or joint pain and he's had no fever      History reviewed. No pertinent past medical history.  There are no active problems to display for this patient.   Past Surgical History:  Procedure Laterality Date  . ROTATOR CUFF REPAIR         Home Medications    Prior to Admission medications   Not on File    Family History No family history on file.  Social History Social History  Substance Use Topics  . Smoking status: Never Smoker  . Smokeless tobacco: Not on file  . Alcohol use Yes     Comment: occasionally     Allergies   Patient has no known allergies.   Review of Systems Review of Systems  Constitutional: Negative.   HENT: Negative for sore throat.   Genitourinary: Positive for discharge and penile pain.  Musculoskeletal: Negative for joint swelling.     Physical Exam Triage Vital Signs ED Triage Vitals [08/19/16 1350]  Enc Vitals Group     BP 120/76     Pulse Rate 76     Resp 16     Temp 98.8 F (37.1 C)     Temp Source Oral     SpO2 99 %     Weight      Height      Head Circumference      Peak Flow      Pain Score      Pain Loc      Pain Edu?      Excl. in GC?    No data found.   Updated Vital Signs BP 120/76   Pulse 76   Temp 98.8 F (37.1 C) (Oral)   Resp 16   SpO2 99%    Physical Exam  Constitutional: He is oriented to person, place, and time. He appears well-developed and well-nourished.  HENT:  Head: Normocephalic.  Right Ear: External ear normal.  Left Ear: External ear normal.  Mouth/Throat: Oropharynx is clear and moist.  Eyes:  Conjunctivae are normal. Pupils are equal, round, and reactive to light.  Neck: Normal range of motion. Neck supple.  Pulmonary/Chest: Effort normal.  Genitourinary:  Genitourinary Comments: Penile discharge: Yellow  Musculoskeletal: Normal range of motion.  Neurological: He is alert and oriented to person, place, and time.  Skin: Skin is warm and dry.  Nursing note and vitals reviewed.    UC Treatments / Results  Labs (all labs ordered are listed, but only abnormal results are displayed) Labs Reviewed - No data to display  EKG  EKG Interpretation None       Radiology No results found.  Procedures Procedures (including critical care time)  Medications Ordered in UC Medications  cefTRIAXone (ROCEPHIN) injection 250 mg (not administered)  azithromycin (ZITHROMAX) tablet 1,000 mg (not administered)     Initial Impression / Assessment and Plan / UC Course  I have reviewed the triage vital signs and the nursing notes.  Pertinent labs & imaging results that were available during my care of the patient  were reviewed by me and considered in my medical decision making (see chart for details).     Final Clinical Impressions(s) / UC Diagnoses   Final diagnoses:  Gonorrhea    New Prescriptions Current Discharge Medication List       Elvina SidleKurt Anndee Connett, MD 08/19/16 1410

## 2018-12-25 ENCOUNTER — Encounter: Payer: Self-pay | Admitting: Physician Assistant

## 2018-12-25 ENCOUNTER — Other Ambulatory Visit: Payer: Self-pay

## 2018-12-25 ENCOUNTER — Ambulatory Visit (INDEPENDENT_AMBULATORY_CARE_PROVIDER_SITE_OTHER): Payer: Self-pay

## 2018-12-25 ENCOUNTER — Ambulatory Visit (INDEPENDENT_AMBULATORY_CARE_PROVIDER_SITE_OTHER): Payer: Self-pay | Admitting: Orthopaedic Surgery

## 2018-12-25 DIAGNOSIS — M79605 Pain in left leg: Secondary | ICD-10-CM

## 2018-12-25 MED ORDER — PREDNISONE 10 MG (21) PO TBPK: ORAL_TABLET | ORAL | 0 refills | Status: AC

## 2018-12-25 MED ORDER — METHOCARBAMOL 500 MG PO TABS: 500.0000 mg | ORAL_TABLET | Freq: Every day | ORAL | 0 refills | Status: AC | PRN

## 2018-12-25 NOTE — Progress Notes (Signed)
Office Visit Note   Patient: Keith Poole           Date of Birth: 1991/05/17           MRN: [id number] Visit Date: 12/25/2018              Requested by: No referring provider defined for this encounter. PCP: Patient, No Pcp Per   Assessment & Plan: Visit Diagnoses:  1. Pain in left leg     Plan: Pression is left IT band syndrome and questionable hip flexor strain.  We will start the patient on steroid taper, muscle relaxer and send him to formal physical therapy which I think will help most.  He will follow-up with [place] as needed.  He will call with concerns or questions in the meantime.  Follow-Up Instructions: Return if symptoms worsen or fail to improve.   Orders:  Orders Placed This Encounter  Procedures  . XR Lumbar Spine 2-3 Views   Meds ordered this encounter  Medications  . predniSONE (STERAPRED UNI-PAK 21 TAB) 10 MG (21) TBPK tablet    Sig: Take as directed    Dispense:  21 tablet    Refill:  0  . methocarbamol (ROBAXIN) 500 MG tablet    Sig: Take 1 tablet (500 mg total) by mouth daily as needed for muscle spasms.    Dispense:  10 tablet    Refill:  0      Procedures: No procedures performed   Clinical Data: No additional findings.   Subjective: No chief complaint on file.   HPI patient is a pleasant 28 year old gentleman who presents our clinic today with left lateral hip pain.  This has been ongoing for the past several months and has progressively worsened.  No known injury.  He does work as a Chief Financial Officer heavy objects.  The pain he has is to the lateral hip and radiates to the lateral knee.  He denies any groin or anterior thigh pain.  No lower back pain.  He describes this as a deep ache worse when he is walking upstairs.  He has tried Advil with mild relief of symptoms.  No weakness to the left lower extremity.  No numbness, tingling or burning.  No previous back or hip pathology.  Review of Systems as detailed in  HPI.  All others reviewed and are negative.   Objective: Vital Signs: There were no vitals taken for this visit.  Physical Exam well-developed well-nourished gentleman in no acute distress.  Alert and oriented x3.  Ortho Exam examination of his left lower extremity reveals minimal tenderness over the greater trochanter.  He has increased pain and tightness with external rotation of the hip.  Negative logroll negative Fater.  Positive straight leg raise.  Slight tenderness over the hip flexors.  Normal lumbar exam.  No focal weakness.  He is neurovascular intact distally.  Specialty Comments:  No specialty comments available.  Imaging: Xr Lumbar Spine 2-3 Views  Result Date: 12/25/2018 Minimal joint space narrowing L5-S1.  Otherwise, no acute findings    PMFS History: There are no active problems to display for this patient.  History reviewed. No pertinent past medical history.  History reviewed. No pertinent family history.  Past Surgical History:  Procedure Laterality Date  . ROTATOR CUFF REPAIR     Social History   Occupational History  . Not on file  Tobacco Use  . Smoking status: Never Smoker  Substance and Sexual Activity  .  Alcohol use: Yes    Comment: occasionally  . Drug use: No  . Sexual activity: Not on file

## 2024-05-18 ENCOUNTER — Ambulatory Visit: Payer: Self-pay

## 2024-05-18 NOTE — Telephone Encounter (Signed)
 FYI Only or Action Required?: FYI only for provider: Urgent Care.  Patient was last seen in primary care on Not established.  Called Nurse Triage reporting Back Pain.  Symptoms began several weeks ago.  Interventions attempted: Rest, hydration, or home remedies and Ice/heat application.  Symptoms are: gradually worsening.  Triage Disposition: See HCP Within 4 Hours (Or PCP Triage)  Patient/caregiver understands and will follow disposition?: YesCopied from Reason for Disposition  [1] SEVERE back pain (e.g., excruciating, unable to do any normal activities) AND [2] not improved 2 hours after pain medicine  Answer Assessment - Initial Assessment Questions 1. ONSET: When did the pain begin? (e.g., minutes, hours, days)     2  weeks ago-worsening  2. LOCATION: Where does it hurt? (upper, mid or lower back)     Lower back   3. SEVERITY: How bad is the pain?  (e.g., Scale 1-10; mild, moderate, or severe)     Moderate pain-able to work, difficult to bend over, 8-10/10 pain rating  4. PATTERN: Is the pain constant? (e.g., yes, no; constant, intermittent)      Intermittent  5. RADIATION: Does the pain shoot into your legs or somewhere else?     No  6. CAUSE:  What do you think is causing the back pain?      Possibly lifting heavy objects at [facility] pulled a muscle   7. BACK OVERUSE:  Any recent lifting of heavy objects, strenuous work or exercise?     Yes, heavy lifting at work  8. MEDICINES: What have you taken so far for the pain? (e.g., nothing, acetaminophen, NSAIDS)     None  9. NEUROLOGIC SYMPTOMS: Do you have any weakness, numbness, or problems with bowel/bladder control?     No  10. OTHER SYMPTOMS: Do you have any other symptoms? (e.g., fever, abdomen pain, burning with urination, blood in urine)       No  11. PREGNANCY: Is there any chance you are pregnant? When was your last menstrual period?       NA  Protocols used: Back Pain-A-AH  CRM  [id number]. Topic: Clinical - Red Word Triage >> May 18, 2024  1:25 PM China J wrote: [facility] that prompted transfer to Nurse Triage: Patient's lower back has pain and discomfort.

## 2024-09-10 ENCOUNTER — Ambulatory Visit: Payer: Self-pay | Admitting: Family Medicine
